# Patient Record
Sex: Female | Born: 1990 | Race: Black or African American | Hispanic: No | Marital: Single | Smoking: Current every day smoker
Health system: Southern US, Community
[De-identification: ages and names within clinical notes are randomized; demographics above are authoritative.]

## PROBLEM LIST (undated history)

## (undated) HISTORY — PX: INDUCED ABORTION: SHX677

---
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
## 2010-08-16 LAB — URINALYSIS, ROUTINE W REFLEX MICROSCOPIC
Ketones, ur: 15 mg/dL — AB
Leukocytes, UA: NEGATIVE
Nitrite: NEGATIVE
Protein, ur: NEGATIVE mg/dL
Urobilinogen, UA: 0.2 mg/dL (ref 0.0–1.0)

## 2011-02-11 LAB — CBC
HCT: 34.8 — ABNORMAL LOW
Hemoglobin: 11.9 — ABNORMAL LOW
MCHC: 34.3
RBC: 3.54 — ABNORMAL LOW
RDW: 13.1

## 2011-10-15 ENCOUNTER — Emergency Department (HOSPITAL_BASED_OUTPATIENT_CLINIC_OR_DEPARTMENT_OTHER): Payer: No Typology Code available for payment source

## 2011-10-15 ENCOUNTER — Encounter (HOSPITAL_BASED_OUTPATIENT_CLINIC_OR_DEPARTMENT_OTHER): Payer: Self-pay | Admitting: *Deleted

## 2011-10-15 ENCOUNTER — Emergency Department (HOSPITAL_BASED_OUTPATIENT_CLINIC_OR_DEPARTMENT_OTHER)
Admission: EM | Admit: 2011-10-15 | Discharge: 2011-10-15 | Disposition: A | Discharge status: Home / Self Care | Payer: No Typology Code available for payment source | Attending: Emergency Medicine | Admitting: Emergency Medicine

## 2011-10-15 DIAGNOSIS — B9689 Other specified bacterial agents as the cause of diseases classified elsewhere: Secondary | ICD-10-CM | POA: Insufficient documentation

## 2011-10-15 DIAGNOSIS — R109 Unspecified abdominal pain: Secondary | ICD-10-CM | POA: Insufficient documentation

## 2011-10-15 DIAGNOSIS — F17200 Nicotine dependence, unspecified, uncomplicated: Secondary | ICD-10-CM | POA: Insufficient documentation

## 2011-10-15 DIAGNOSIS — N898 Other specified noninflammatory disorders of vagina: Secondary | ICD-10-CM | POA: Insufficient documentation

## 2011-10-15 DIAGNOSIS — A499 Bacterial infection, unspecified: Secondary | ICD-10-CM | POA: Insufficient documentation

## 2011-10-15 DIAGNOSIS — N760 Acute vaginitis: Secondary | ICD-10-CM

## 2011-10-15 LAB — URINE MICROSCOPIC-ADD ON

## 2011-10-15 LAB — CBC
MCH: 34.2 pg — ABNORMAL HIGH (ref 26.0–34.0)
MCHC: 34.3 g/dL (ref 30.0–36.0)
MCV: 99.7 fL (ref 78.0–100.0)
Platelets: 286 10*3/uL (ref 150–400)

## 2011-10-15 LAB — DIFFERENTIAL
Basophils Relative: 0 % (ref 0–1)
Eosinophils Absolute: 0.1 10*3/uL (ref 0.0–0.7)
Eosinophils Relative: 1 % (ref 0–5)
Monocytes Relative: 9 % (ref 3–12)
Neutrophils Relative %: 76 % (ref 43–77)

## 2011-10-15 LAB — WET PREP, GENITAL

## 2011-10-15 LAB — BASIC METABOLIC PANEL
BUN: 8 mg/dL (ref 6–23)
Calcium: 9.5 mg/dL (ref 8.4–10.5)
GFR calc Af Amer: >90 mL/min (ref >90)
GFR calc non Af Amer: >90 mL/min (ref >90)
Glucose, Bld: 88 mg/dL (ref 70–99)
Potassium: 3.9 mEq/L (ref 3.5–5.1)
Sodium: 139 mEq/L (ref 135–145)

## 2011-10-15 LAB — URINALYSIS, ROUTINE W REFLEX MICROSCOPIC
Glucose, UA: NEGATIVE mg/dL
Ketones, ur: 40 mg/dL — AB
Nitrite: NEGATIVE
Specific Gravity, Urine: 1.039 — ABNORMAL HIGH (ref 1.005–1.030)
pH: 7 (ref 5.0–8.0)

## 2011-10-15 LAB — PREGNANCY, URINE: Preg Test, Ur: NEGATIVE

## 2011-10-15 MED ORDER — METRONIDAZOLE 500 MG PO TABS: 500.0000 mg | ORAL_TABLET | Freq: Two times a day (BID) | ORAL | Status: AC

## 2011-10-15 MED ORDER — NAPROXEN 500 MG PO TABS: 500.0000 mg | ORAL_TABLET | Freq: Two times a day (BID) | ORAL | Status: AC

## 2011-10-15 NOTE — ED Provider Notes (Signed)
History     CSN: [id number]  Arrival date & time 10/15/11  1408   None     2:52 PM HPI Patient reports lower abdominal cramping that began 3 days ago. Reports she's concerned she had a C-section 4 weeks ago. Reports she also began having vaginal bleeding about one or 2 days ago. Denies vaginal discharge, urinary symptoms, nausea, vomiting, diarrhea, back pain  Patient is a 21 y.o. female presenting with abdominal pain. The history is provided by the patient.  Abdominal Pain The primary symptoms of the illness include abdominal pain, vaginal discharge and vaginal bleeding. The primary symptoms of the illness do not include fever, shortness of breath, nausea, vomiting, diarrhea or dysuria. The onset of the illness was sudden. The problem has not changed since onset. The vaginal discharge is not associated with dysuria.   The patient states that she believes she is currently not pregnant. Symptoms associated with the illness do not include chills, anorexia, heartburn, constipation, urgency, hematuria, frequency or back pain.    History reviewed. No pertinent past medical history.  Past Surgical History  Procedure Date  . Cesarean section     History reviewed. No pertinent family history.  History  Substance Use Topics  . Smoking status: Current Everyday Smoker  . Smokeless tobacco: Not on file  . Alcohol Use: No    OB History    Grav Para Term Preterm Abortions TAB SAB Ect Mult Living   1 1              Review of Systems  Constitutional: Negative for fever and chills.  Respiratory: Negative for shortness of breath.   Cardiovascular: Negative for chest pain.  Gastrointestinal: Positive for abdominal pain. Negative for heartburn, nausea, vomiting, diarrhea, constipation and anorexia.  Genitourinary: Positive for vaginal bleeding and vaginal discharge. Negative for dysuria, urgency, frequency, hematuria, flank pain and vaginal pain.  Musculoskeletal: Negative for back pain.    All other systems reviewed and are negative.    Allergies  Review of patient's allergies indicates no known allergies.  Home Medications  No current outpatient prescriptions on file.  BP 116/63  Pulse 84  Temp(Src) 97.8 F (36.6 C) (Oral)  Resp 20  Ht 5\' 5"  (1.651 m)  Wt 127 lb (57.607 kg)  BMI 21.13 kg/m2  SpO2 100%  Physical Exam  Vitals reviewed. Constitutional: She is oriented to person, place, and time. Vital signs are normal. She appears well-developed and well-nourished.  HENT:  Head: Normocephalic and atraumatic.  Eyes: Conjunctivae are normal. Pupils are equal, round, and reactive to light.  Neck: Normal range of motion. Neck supple.  Cardiovascular: Normal rate, regular rhythm and normal heart sounds.  Exam reveals no friction rub.   No murmur heard. Pulmonary/Chest: Effort normal and breath sounds normal. She has no wheezes. She has no rhonchi. She has no rales. She exhibits no tenderness.  Abdominal: Soft. Bowel sounds are normal. She exhibits no distension and no mass. There is no tenderness. There is no rebound and no guarding.  Genitourinary: There is no tenderness or lesion on the right labia. There is no tenderness or lesion on the left labia. Uterus is not tender. Cervix exhibits no motion tenderness and no discharge. Right adnexum displays no tenderness and no fullness. Left adnexum displays no tenderness and no fullness. Vaginal discharge (copious yellow discharge) found.  Musculoskeletal: Normal range of motion.  Lymphadenopathy:       Left: No inguinal adenopathy present.  Neurological: She is alert and  oriented to person, place, and time. Coordination normal.  Skin: Skin is warm and dry. No rash noted. No erythema. No pallor.    ED Course  Procedures  Results for orders placed during the hospital encounter of 10/15/11  CBC      Component Value Range   WBC 11.4 (*) 4.0 - 10.5 (K/uL)   RBC 3.83 (*) 3.87 - 5.11 (MIL/uL)   Hemoglobin 13.1  12.0 -  15.0 (g/dL)   HCT 16.1  09.6 - 04.5 (%)   MCV 99.7  78.0 - 100.0 (fL)   MCH 34.2 (*) 26.0 - 34.0 (pg)   MCHC 34.3  30.0 - 36.0 (g/dL)   RDW 40.9  81.1 - 91.4 (%)   Platelets 286  150 - 400 (K/uL)  DIFFERENTIAL      Component Value Range   Neutrophils Relative 76  43 - 77 (%)   Neutro Abs 8.7 (*) 1.7 - 7.7 (K/uL)   Lymphocytes Relative 15  12 - 46 (%)   Lymphs Abs 1.7  0.7 - 4.0 (K/uL)   Monocytes Relative 9  3 - 12 (%)   Monocytes Absolute 1.0  0.1 - 1.0 (K/uL)   Eosinophils Relative 1  0 - 5 (%)   Eosinophils Absolute 0.1  0.0 - 0.7 (K/uL)   Basophils Relative 0  0 - 1 (%)   Basophils Absolute 0.0  0.0 - 0.1 (K/uL)  BASIC METABOLIC PANEL      Component Value Range   Sodium 139  135 - 145 (mEq/L)   Potassium 3.9  3.5 - 5.1 (mEq/L)   Chloride 103  96 - 112 (mEq/L)   CO2 28  19 - 32 (mEq/L)   Glucose, Bld 88  70 - 99 (mg/dL)   BUN 8  6 - 23 (mg/dL)   Creatinine, Ser 7.82  0.50 - 1.10 (mg/dL)   Calcium 9.5  8.4 - 95.6 (mg/dL)   GFR calc non Af Amer >90  >90 (mL/min)   GFR calc Af Amer >90  >90 (mL/min)  WET PREP, GENITAL      Component Value Range   Yeast Wet Prep HPF POC NONE SEEN  NONE SEEN    Trich, Wet Prep NONE SEEN  NONE SEEN    Clue Cells Wet Prep HPF POC MANY (*) NONE SEEN    WBC, Wet Prep HPF POC MANY (*) NONE SEEN   URINALYSIS, ROUTINE W REFLEX MICROSCOPIC      Component Value Range   Color, Urine AMBER (*) YELLOW    APPearance CLOUDY (*) CLEAR    Specific Gravity, Urine 1.039 (*) 1.005 - 1.030    pH 7.0  5.0 - 8.0    Glucose, UA NEGATIVE  NEGATIVE (mg/dL)   Hgb urine dipstick NEGATIVE  NEGATIVE    Bilirubin Urine SMALL (*) NEGATIVE    Ketones, ur 40 (*) NEGATIVE (mg/dL)   Protein, ur 213 (*) NEGATIVE (mg/dL)   Urobilinogen, UA 1.0  0.0 - 1.0 (mg/dL)   Nitrite NEGATIVE  NEGATIVE    Leukocytes, UA SMALL (*) NEGATIVE   PREGNANCY, URINE      Component Value Range   Preg Test, Ur NEGATIVE  NEGATIVE   URINE MICROSCOPIC-ADD ON      Component Value Range    Squamous Epithelial / LPF MANY (*) RARE    WBC, UA 7-10  <3 (WBC/hpf)   RBC / HPF 0-2  <3 (RBC/hpf)   Bacteria, UA MANY (*) RARE    Urine-Other MUCOUS PRESENT  Dg Abd 2 Views  10/15/2011  *RADIOLOGY REPORT*  Clinical Data: C-section 4 weeks ago, with abdominal pain  ABDOMEN - 2 VIEW  Comparison: None.  Findings: Lung bases are clear.  The bowel gas pattern is nonobstructive.  There are two tiny ( approximately 2-3 mm) metallic densities projecting over the right aspect of the sacrum. These could be small surgical clips or suture.  Suggest clinical correlation.  No evidence of free intraperitoneal air.  The bones are unremarkable.  IMPRESSION:  1.  Nonobstructive bowel gas pattern. 2.  Two tiny metallic densities project over the right aspect of the pelvis, likely surgical clips or suture.  Original Report Authenticated By: Britta Mccreedy, M.D.     MDM   Will treat for BV. Advised f/u with GYN if no improvement of abdominal pain in 2-3 day. Advised patient that she has a pending GC and Chlamydia. Patient voices understanding and reactive to      Thomasene Lot, PA-C 10/15/11 1714

## 2011-10-15 NOTE — Discharge Instructions (Signed)
Abdominal Pain Abdominal pain can be caused by many things. Your caregiver decides the seriousness of your pain by an examination and possibly blood tests and X-rays. Many cases can be observed and treated at home. Most abdominal pain is not caused by a disease and will probably improve without treatment. However, in many cases, more time must pass before a clear cause of the pain can be found. Before that point, it may not be known if you need more testing, or if hospitalization or surgery is needed. HOME CARE INSTRUCTIONS   Do not take laxatives unless directed by your caregiver.   Take pain medicine only as directed by your caregiver.   Only take over-the-counter or prescription medicines for pain, discomfort, or fever as directed by your caregiver.   Try a clear liquid diet (broth, tea, or water) for as long as directed by your caregiver. Slowly move to a bland diet as tolerated.  SEEK IMMEDIATE MEDICAL CARE IF:   The pain does not go away.   You have a fever.   You keep throwing up (vomiting).   The pain is felt only in portions of the abdomen. Pain in the right side could possibly be appendicitis. In an adult, pain in the left lower portion of the abdomen could be colitis or diverticulitis.   You pass bloody or black tarry stools.  MAKE SURE YOU:   Understand these instructions.   Will watch your condition.   Will get help right away if you are not doing well or get worse.  Document Released: 02/16/2005 Document Revised: 04/28/2011 Document Reviewed: 12/26/2007 [facility] Patient Information 2012 [place], [place].Bacterial Vaginosis Bacterial vaginosis (BV) is a vaginal infection where the normal balance of bacteria in the vagina is disrupted. The normal balance is then replaced by an overgrowth of certain bacteria. There are several different kinds of bacteria that can cause BV. BV is the most common vaginal infection in women of childbearing age. CAUSES   The cause of BV is not  fully understood. BV develops when there is an increase or imbalance of harmful bacteria.   Some activities or behaviors can upset the normal balance of bacteria in the vagina and put women at increased risk including:   Having a new sex partner or multiple sex partners.   Douching.   Using an intrauterine device (IUD) for contraception.   It is not clear what role sexual activity plays in the development of BV. However, women that have never had sexual intercourse are rarely infected with BV.  Women do not get BV from toilet seats, bedding, swimming pools or from touching objects around them.  SYMPTOMS   Grey vaginal discharge.   A fish-like odor with discharge, especially after sexual intercourse.   Itching or burning of the vagina and vulva.   Burning or pain with urination.   Some women have no signs or symptoms at all.  DIAGNOSIS  Your caregiver must examine the vagina for signs of BV. Your caregiver will perform lab tests and look at the sample of vaginal fluid through a microscope. They will look for bacteria and abnormal cells (clue cells), a pH test higher than 4.5, and a positive amine test all associated with BV.  RISKS AND COMPLICATIONS   Pelvic inflammatory disease (PID).   Infections following gynecology surgery.   Developing HIV.   Developing herpes virus.  TREATMENT  Sometimes BV will clear up without treatment. However, all women with symptoms of BV should be treated to avoid complications,  especially if gynecology surgery is planned. Female partners generally do not need to be treated. However, BV may spread between female sex partners so treatment is helpful in preventing a recurrence of BV.   BV may be treated with antibiotics. The antibiotics come in either pill or vaginal cream forms. Either can be used with nonpregnant or pregnant women, but the recommended dosages differ. These antibiotics are not harmful to the baby.   BV can recur after treatment. If  this happens, a second round of antibiotics will often be prescribed.   Treatment is important for pregnant women. If not treated, BV can cause a premature delivery, especially for a pregnant woman who had a premature birth in the past. All pregnant women who have symptoms of BV should be checked and treated.   For chronic reoccurrence of BV, treatment with a type of prescribed gel vaginally twice a week is helpful.  HOME CARE INSTRUCTIONS   Finish all medication as directed by your caregiver.   Do not have sex until treatment is completed.   Tell your sexual partner that you have a vaginal infection. They should see their caregiver and be treated if they have problems, such as a mild rash or itching.   Practice safe sex. Use condoms. Only have 1 sex partner.  PREVENTION  Basic prevention steps can help reduce the risk of upsetting the natural balance of bacteria in the vagina and developing BV:  Do not have sexual intercourse (be abstinent).   Do not douche.   Use all of the medicine prescribed for treatment of BV, even if the signs and symptoms go away.   Tell your sex partner if you have BV. That way, they can be treated, if needed, to prevent reoccurrence.  SEEK MEDICAL CARE IF:   Your symptoms are not improving after 3 days of treatment.   You have increased discharge, pain, or fever.  MAKE SURE YOU:   Understand these instructions.   Will watch your condition.   Will get help right away if you are not doing well or get worse.  FOR MORE INFORMATION  Division of STD Prevention (DSTDP), Centers for Disease Control and Prevention: [employer] American Social Health Association (ASHA): www.ashastd.org  Document Released: 05/09/2005 Document Revised: 04/28/2011 Document Reviewed: 10/30/2008 [facility] Patient Information 2012 [place], [place].

## 2011-10-15 NOTE — ED Notes (Addendum)
Pt c/o generalized abd pain onset Thursday. S/P C-section 4 weeks ago. Some vag bleeding at present. Denies other s/s.

## 2011-10-15 NOTE — ED Provider Notes (Signed)
Medical screening examination/treatment/procedure(s) were performed by non-physician practitioner and as supervising physician I was immediately available for consultation/collaboration.  Ethelda Chick, MD 10/15/11 [phone]

## 2011-10-19 LAB — GC/CHLAMYDIA PROBE AMP, GENITAL: Chlamydia, DNA Probe: NEGATIVE

## 2011-10-20 NOTE — ED Notes (Signed)
+  Gonorrhea. Chart sent to EDP office for review. DHHS attached. °

## 2011-10-20 NOTE — ED Notes (Signed)
Chart returned from EDP office. If patient did not receive treatment for suspected STD during prior visit, then recommend return to ER for administration of Rocephin 250 mg IM one time dose. No sexual activity x 1 week or until STD resolved. Partner needs to be tested. Prescribed/reviewed by Fayrene Helper PA-C.

## 2011-10-22 NOTE — ED Notes (Signed)
Attempted to call patient. No answer. Left voicemail.

## 2011-10-23 NOTE — ED Notes (Signed)
Attempt to call patient, message left to call flow manager's #

## 2011-11-15 NOTE — ED Notes (Signed)
Letter sent to Epic Addressno response after 30 days -chart appended and closed out.

## 2013-04-06 ENCOUNTER — Emergency Department (HOSPITAL_BASED_OUTPATIENT_CLINIC_OR_DEPARTMENT_OTHER)
Admission: EM | Admit: 2013-04-06 | Discharge: 2013-04-06 | Disposition: A | Discharge status: Home / Self Care | Payer: 59 | Attending: Emergency Medicine | Admitting: Emergency Medicine

## 2013-04-06 ENCOUNTER — Encounter (HOSPITAL_BASED_OUTPATIENT_CLINIC_OR_DEPARTMENT_OTHER): Payer: Self-pay | Admitting: Emergency Medicine

## 2013-04-06 DIAGNOSIS — Z3202 Encounter for pregnancy test, result negative: Secondary | ICD-10-CM | POA: Insufficient documentation

## 2013-04-06 DIAGNOSIS — R509 Fever, unspecified: Secondary | ICD-10-CM | POA: Insufficient documentation

## 2013-04-06 DIAGNOSIS — N760 Acute vaginitis: Secondary | ICD-10-CM | POA: Insufficient documentation

## 2013-04-06 DIAGNOSIS — Z9889 Other specified postprocedural states: Secondary | ICD-10-CM | POA: Insufficient documentation

## 2013-04-06 DIAGNOSIS — A499 Bacterial infection, unspecified: Secondary | ICD-10-CM | POA: Insufficient documentation

## 2013-04-06 DIAGNOSIS — B9689 Other specified bacterial agents as the cause of diseases classified elsewhere: Secondary | ICD-10-CM | POA: Insufficient documentation

## 2013-04-06 DIAGNOSIS — R109 Unspecified abdominal pain: Secondary | ICD-10-CM

## 2013-04-06 LAB — URINALYSIS, ROUTINE W REFLEX MICROSCOPIC
Bilirubin Urine: NEGATIVE
Glucose, UA: NEGATIVE mg/dL
Hgb urine dipstick: NEGATIVE
Ketones, ur: NEGATIVE mg/dL
Leukocytes, UA: NEGATIVE
Protein, ur: NEGATIVE mg/dL
pH: 6.5 (ref 5.0–8.0)

## 2013-04-06 LAB — WET PREP, GENITAL
Trich, Wet Prep: NONE SEEN
WBC, Wet Prep HPF POC: NONE SEEN

## 2013-04-06 MED ORDER — METRONIDAZOLE 500 MG PO TABS
2000.0000 mg | ORAL_TABLET | Freq: Once | ORAL | Status: AC
  Administered 2013-04-06: 2000 mg via ORAL
  Filled 2013-04-06: qty 4

## 2013-04-06 MED ORDER — ONDANSETRON 8 MG PO TBDP
8.0000 mg | ORAL_TABLET | Freq: Once | ORAL | Status: AC
  Administered 2013-04-06: 8 mg via ORAL
  Filled 2013-04-06: qty 1

## 2013-04-06 NOTE — ED Notes (Signed)
Pt asked for guidance on how to prevent UTI/frequent vaginal infections.  Instructed patient on avoiding scented feminine products, urinating after sexual intercourse, wearing cotton underwear, avoiding irritating products which may help.  Pt verbalized understanding.

## 2013-04-06 NOTE — ED Notes (Signed)
Pt having right sided lower abdominal pain x 2 days.  No N/V/D.  Denies dysuria but did have fever yesterday.

## 2013-04-06 NOTE — ED Provider Notes (Addendum)
CSN: [id number]     Arrival date & time 04/06/13  6440 History  This chart was scribed for Hilario Quarry, MD by Clydene Laming, ED Scribe. This patient was seen in room MH07/MH07 and the patient's care was started at 7:00 PM.    Chief Complaint  Patient presents with  . Abdominal Pain    The history is provided by the patient. No language interpreter was used.  HPI Comments: Heather Sharp is a 22 y.o. female who presents to the Emergency Department complaining of stabbing, constant abd pain beginning yesterday. Pt also states she had a fever last night. Pt denies nausea, chills, vaginal discharge, vomiting, and diarrhea. She states the pain is not improved or worsened wit any treatments. Pt states her symptoms are similar to when she was positive for chlamydia , treated last year. Pt is on birth control and reports having one sexual partner.  History reviewed. No pertinent past medical history. Past Surgical History  Procedure Laterality Date  . Cesarean section     History reviewed. No pertinent family history. History  Substance Use Topics  . Smoking status: Current Every Day Smoker  . Smokeless tobacco: Not on file  . Alcohol Use: No   OB History   Grav Para Term Preterm Abortions TAB SAB Ect Mult Living   1 1             Review of Systems  Constitutional: Positive for fever.  Gastrointestinal: Positive for abdominal pain. Negative for nausea, vomiting and diarrhea.  Genitourinary: Negative for dysuria and vaginal discharge.    Allergies  Review of patient's allergies indicates no known allergies.  Home Medications   Current Outpatient Rx  Name  Route  Sig  Dispense  Refill  . UNKNOWN TO PATIENT      Birth control pills          Triage Vitals:BP 138/66  Pulse 88  Temp(Src) 98.8 F (37.1 C)  Resp 16  Ht 5\' 5"  (1.651 m)  Wt 123 lb 11.2 oz (56.11 kg)  BMI 20.58 kg/m2  SpO2 97%  LMP 03/16/2013 Physical Exam  Nursing note and vitals reviewed. Constitutional:  She is oriented to person, place, and time. She appears well-developed and well-nourished.  HENT:  Head: Normocephalic and atraumatic.  Right Ear: External ear normal.  Left Ear: External ear normal.  Nose: Nose normal.  Mouth/Throat: Oropharynx is clear and moist.  Eyes: Conjunctivae and EOM are normal. Pupils are equal, round, and reactive to light.  Neck: Normal range of motion. Neck supple.  Cardiovascular: Normal rate, regular rhythm, normal heart sounds and intact distal pulses.   Pulmonary/Chest: Effort normal and breath sounds normal.  Abdominal: Soft. Bowel sounds are normal.  Genitourinary: Vagina normal and uterus normal. No vaginal discharge found.  Musculoskeletal: Normal range of motion.  Neurological: She is alert and oriented to person, place, and time. She has normal reflexes.  Skin: Skin is warm and dry.  Psychiatric: She has a normal mood and affect. Her behavior is normal. Thought content normal.    ED Course  Procedures (including critical care time) DIAGNOSTIC STUDIES: Oxygen Saturation is 97% on RA, normal by my interpretation.    COORDINATION OF CARE: 7:08 PM- Discussed treatment plan with pt at bedside. Pt verbalized understanding and agreement with plan.   Labs Review Labs Reviewed  URINALYSIS, ROUTINE W REFLEX MICROSCOPIC  PREGNANCY, URINE   Imaging Review No results found.  EKG Interpretation   None     22  y.o. Female with abdominal discomfort and vomiting.  Pelvic exam with mild discharge,no cmt.  Wet prep positive for bv and treated with metronidazole.   MDM  No diagnosis found. I personally performed the services described in this documentation, which was scribed in my presence. The recorded information has been reviewed and considered.      Hilario Quarry, MD 04/06/13 1610  Hilario Quarry, MD 04/06/13 [phone]

## 2013-04-08 LAB — GC/CHLAMYDIA PROBE AMP: GC Probe RNA: NEGATIVE

## 2013-04-09 ENCOUNTER — Telehealth (HOSPITAL_BASED_OUTPATIENT_CLINIC_OR_DEPARTMENT_OTHER): Payer: Self-pay | Admitting: Emergency Medicine

## 2013-08-30 ENCOUNTER — Encounter (HOSPITAL_BASED_OUTPATIENT_CLINIC_OR_DEPARTMENT_OTHER): Payer: Self-pay | Admitting: Emergency Medicine

## 2013-08-30 ENCOUNTER — Emergency Department (HOSPITAL_BASED_OUTPATIENT_CLINIC_OR_DEPARTMENT_OTHER)
Admission: EM | Admit: 2013-08-30 | Discharge: 2013-08-30 | Disposition: A | Discharge status: Home / Self Care | Payer: 59 | Attending: Emergency Medicine | Admitting: Emergency Medicine

## 2013-08-30 DIAGNOSIS — F17200 Nicotine dependence, unspecified, uncomplicated: Secondary | ICD-10-CM | POA: Insufficient documentation

## 2013-08-30 DIAGNOSIS — T148XXA Other injury of unspecified body region, initial encounter: Secondary | ICD-10-CM

## 2013-08-30 DIAGNOSIS — IMO0002 Reserved for concepts with insufficient information to code with codable children: Secondary | ICD-10-CM | POA: Insufficient documentation

## 2013-08-30 DIAGNOSIS — Y9389 Activity, other specified: Secondary | ICD-10-CM | POA: Insufficient documentation

## 2013-08-30 DIAGNOSIS — Y92410 Unspecified street and highway as the place of occurrence of the external cause: Secondary | ICD-10-CM | POA: Insufficient documentation

## 2013-08-30 MED ORDER — CYCLOBENZAPRINE HCL 10 MG PO TABS: 10.0000 mg | ORAL_TABLET | Freq: Two times a day (BID) | ORAL | Status: DC | PRN

## 2013-08-30 MED ORDER — IBUPROFEN 800 MG PO TABS: 800.0000 mg | ORAL_TABLET | Freq: Three times a day (TID) | ORAL | Status: DC

## 2013-08-30 MED ORDER — HYDROCODONE-ACETAMINOPHEN 5-325 MG PO TABS: 1.0000 | ORAL_TABLET | ORAL | Status: DC | PRN

## 2013-08-30 NOTE — Discharge Instructions (Signed)
Cryotherapy Cryotherapy means treatment with cold. Ice or gel packs can be used to reduce both pain and swelling. Ice is the most helpful within the first 24 to 48 hours after an injury or flareup from overusing a muscle or joint. Sprains, strains, spasms, burning pain, shooting pain, and aches can all be eased with ice. Ice can also be used when recovering from surgery. Ice is effective, has very few side effects, and is safe for most people to use. PRECAUTIONS  Ice is not a safe treatment option for people with:  Raynaud's phenomenon. This is a condition affecting small blood vessels in the extremities. Exposure to cold may cause your problems to return.  Cold hypersensitivity. There are many forms of cold hypersensitivity, including:  Cold urticaria. Red, itchy hives appear on the skin when the tissues begin to warm after being iced.  Cold erythema. This is a red, itchy rash caused by exposure to cold.  Cold hemoglobinuria. Red blood cells break down when the tissues begin to warm after being iced. The hemoglobin that carry oxygen are passed into the urine because they cannot combine with blood proteins fast enough.  Numbness or altered sensitivity in the area being iced. If you have any of the following conditions, do not use ice until you have discussed cryotherapy with your caregiver:  Heart conditions, such as arrhythmia, angina, or chronic heart disease.  High blood pressure.  Healing wounds or open skin in the area being iced.  Current infections.  Rheumatoid arthritis.  Poor circulation.  Diabetes. Ice slows the blood flow in the region it is applied. This is beneficial when trying to stop inflamed tissues from spreading irritating chemicals to surrounding tissues. However, if you expose your skin to cold temperatures for too long or without the proper protection, you can damage your skin or nerves. Watch for signs of skin damage due to cold. HOME CARE INSTRUCTIONS Follow  these tips to use ice and cold packs safely.  Place a dry or damp towel between the ice and skin. A damp towel will cool the skin more quickly, so you may need to shorten the time that the ice is used.  For a more rapid response, add gentle compression to the ice.  Ice for no more than 10 to 20 minutes at a time. The bonier the area you are icing, the less time it will take to get the benefits of ice.  Check your skin after 5 minutes to make sure there are no signs of a poor response to cold or skin damage.  Rest 20 minutes or more in between uses.  Once your skin is numb, you can end your treatment. You can test numbness by very lightly touching your skin. The touch should be so light that you do not see the skin dimple from the pressure of your fingertip. When using ice, most people will feel these normal sensations in this order: cold, burning, aching, and numbness.  Do not use ice on someone who cannot communicate their responses to pain, such as small children or people with dementia. HOW TO MAKE AN ICE PACK Ice packs are the most common way to use ice therapy. Other methods include ice massage, ice baths, and cryo-sprays. Muscle creams that cause a cold, tingly feeling do not offer the same benefits that ice offers and should not be used as a substitute unless recommended by your caregiver. To make an ice pack, do one of the following:  Place crushed ice or  a bag of frozen vegetables in a sealable plastic bag. Squeeze out the excess air. Place this bag inside another plastic bag. Slide the bag into a pillowcase or place a damp towel between your skin and the bag.  Mix 3 parts water with 1 part rubbing alcohol. Freeze the mixture in a sealable plastic bag. When you remove the mixture from the freezer, it will be slushy. Squeeze out the excess air. Place this bag inside another plastic bag. Slide the bag into a pillowcase or place a damp towel between your skin and the bag. SEEK MEDICAL  CARE IF:  You develop white spots on your skin. This may give the skin a blotchy (mottled) appearance.  Your skin turns blue or pale.  Your skin becomes waxy or hard.  Your swelling gets worse. MAKE SURE YOU:   Understand these instructions.  Will watch your condition.  Will get help right away if you are not doing well or get worse. Document Released: 01/03/2011 Document Revised: 08/01/2011 Document Reviewed: 01/03/2011 [facility] Patient Information 2014 [place], [place]. Muscle Strain A muscle strain is an injury that occurs when a muscle is stretched beyond its normal length. Usually a small number of muscle fibers are torn when this happens. Muscle strain is rated in degrees. First-degree strains have the least amount of muscle fiber tearing and pain. Second-degree and third-degree strains have increasingly more tearing and pain.  Usually, recovery from muscle strain takes 1 2 weeks. Complete healing takes 5 6 weeks.  CAUSES  Muscle strain happens when a sudden, violent force placed on a muscle stretches it too far. This may occur with lifting, sports, or a fall.  RISK FACTORS Muscle strain is especially common in athletes.  SIGNS AND SYMPTOMS At the site of the muscle strain, there may be:  Pain.  Bruising.  Swelling.  Difficulty using the muscle due to pain or lack of normal function. DIAGNOSIS  Your health care provider will perform a physical exam and ask about your medical history. TREATMENT  Often, the best treatment for a muscle strain is resting, icing, and applying cold compresses to the injured area.  HOME CARE INSTRUCTIONS   Use the PRICE method of treatment to promote muscle healing during the first 2 3 days after your injury. The PRICE method involves:  Protecting the muscle from being injured again.  Restricting your activity and resting the injured body part.  Icing your injury. To do this, put ice in a plastic bag. Place a towel between your skin and  the bag. Then, apply the ice and leave it on from 15 20 minutes each hour. After the third day, switch to moist heat packs.  Apply compression to the injured area with a splint or elastic bandage. Be careful not to wrap it too tightly. This may interfere with blood circulation or increase swelling.  Elevate the injured body part above the level of your heart as often as you can.  Only take over-the-counter or prescription medicines for pain, discomfort, or fever as directed by your health care provider.  Warming up prior to exercise helps to prevent future muscle strains. SEEK MEDICAL CARE IF:   You have increasing pain or swelling in the injured area.  You have numbness, tingling, or a significant loss of strength in the injured area. MAKE SURE YOU:   Understand these instructions.  Will watch your condition.  Will get help right away if you are not doing well or get worse. Document Released: 05/09/2005 Document  Revised: 02/27/2013 Document Reviewed: 12/06/2012 [facility] Patient Information 2014 [place], [place].

## 2013-08-30 NOTE — ED Provider Notes (Signed)
Medical screening examination/treatment/procedure(s) were performed by non-physician practitioner and as supervising physician I was immediately available for consultation/collaboration.   EKG Interpretation None        Charles B. Karle Starch, MD 08/30/13 1259
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
## 2013-09-05 ENCOUNTER — Emergency Department (HOSPITAL_BASED_OUTPATIENT_CLINIC_OR_DEPARTMENT_OTHER)
Admission: EM | Admit: 2013-09-05 | Discharge: 2013-09-05 | Disposition: A | Discharge status: Home / Self Care | Payer: 59 | Attending: Emergency Medicine | Admitting: Emergency Medicine

## 2013-09-05 ENCOUNTER — Encounter (HOSPITAL_BASED_OUTPATIENT_CLINIC_OR_DEPARTMENT_OTHER): Payer: Self-pay | Admitting: Emergency Medicine

## 2013-09-05 DIAGNOSIS — A499 Bacterial infection, unspecified: Secondary | ICD-10-CM | POA: Insufficient documentation

## 2013-09-05 DIAGNOSIS — N760 Acute vaginitis: Secondary | ICD-10-CM | POA: Insufficient documentation

## 2013-09-05 DIAGNOSIS — F17200 Nicotine dependence, unspecified, uncomplicated: Secondary | ICD-10-CM | POA: Insufficient documentation

## 2013-09-05 DIAGNOSIS — Z9889 Other specified postprocedural states: Secondary | ICD-10-CM | POA: Insufficient documentation

## 2013-09-05 DIAGNOSIS — Z79899 Other long term (current) drug therapy: Secondary | ICD-10-CM | POA: Insufficient documentation

## 2013-09-05 DIAGNOSIS — Z202 Contact with and (suspected) exposure to infections with a predominantly sexual mode of transmission: Secondary | ICD-10-CM

## 2013-09-05 DIAGNOSIS — B9689 Other specified bacterial agents as the cause of diseases classified elsewhere: Secondary | ICD-10-CM | POA: Insufficient documentation

## 2013-09-05 DIAGNOSIS — Z3202 Encounter for pregnancy test, result negative: Secondary | ICD-10-CM | POA: Insufficient documentation

## 2013-09-05 LAB — URINE MICROSCOPIC-ADD ON

## 2013-09-05 LAB — URINALYSIS, ROUTINE W REFLEX MICROSCOPIC
Glucose, UA: NEGATIVE mg/dL
Hgb urine dipstick: NEGATIVE
Ketones, ur: 15 mg/dL — AB
NITRITE: NEGATIVE
PH: 6.5 (ref 5.0–8.0)
PROTEIN: NEGATIVE mg/dL
Specific Gravity, Urine: 1.03 (ref 1.005–1.030)
Urobilinogen, UA: 1 mg/dL (ref 0.0–1.0)

## 2013-09-05 LAB — PREGNANCY, URINE: Preg Test, Ur: NEGATIVE

## 2013-09-05 LAB — WET PREP, GENITAL
Trich, Wet Prep: NONE SEEN
Yeast Wet Prep HPF POC: NONE SEEN

## 2013-09-05 MED ORDER — CEFTRIAXONE SODIUM 250 MG IJ SOLR
250.0000 mg | Freq: Once | INTRAMUSCULAR | Status: AC
  Administered 2013-09-05: 250 mg via INTRAMUSCULAR
  Filled 2013-09-05: qty 250

## 2013-09-05 MED ORDER — AZITHROMYCIN 250 MG PO TABS
1000.0000 mg | ORAL_TABLET | Freq: Once | ORAL | Status: AC
  Administered 2013-09-05: 1000 mg via ORAL
  Filled 2013-09-05: qty 4

## 2013-09-05 MED ORDER — METRONIDAZOLE 500 MG PO TABS: 500.0000 mg | ORAL_TABLET | Freq: Two times a day (BID) | ORAL | Status: DC

## 2013-09-05 MED ORDER — LIDOCAINE HCL (PF) 1 % IJ SOLN
INTRAMUSCULAR | Status: AC
  Administered 2013-09-05: 1.2 mL
  Filled 2013-09-05: qty 5

## 2013-09-05 NOTE — ED Provider Notes (Signed)
CSN: [id number]     Arrival date & time 09/05/13  1402 History   First MD Initiated Contact with Patient 09/05/13 1414     Chief Complaint  Patient presents with  . Vaginal Discharge     (Consider location/radiation/quality/duration/timing/severity/associated sxs/prior Treatment) HPI Comments: Patient is a 23 year old female who presents with vaginal discharge for the past several days. Boyfriend informed her he had chlamydia. She denies any bleeding. Her last menstrual period was one week ago and normal.  Patient is a 23 y.o. female presenting with vaginal discharge. The history is provided by the patient.  Vaginal Discharge Quality:  White Severity:  Moderate Onset quality:  Gradual Duration:  3 days Timing:  Constant Progression:  Worsening Chronicity:  New Relieved by:  Nothing Worsened by:  Nothing tried   History reviewed. No pertinent past medical history. Past Surgical History  Procedure Laterality Date  . Cesarean section     History reviewed. No pertinent family history. History  Substance Use Topics  . Smoking status: Current Every Day Smoker -- 0.50 packs/day    Types: Cigarettes  . Smokeless tobacco: Not on file  . Alcohol Use: No   OB History   Grav Para Term Preterm Abortions TAB SAB Ect Mult Living   1 1             Review of Systems  Genitourinary: Positive for vaginal discharge.  All other systems reviewed and are negative.     Allergies  Review of patient's allergies indicates no known allergies.  Home Medications   Prior to Admission medications   Medication Sig Start Date End Date Taking? Authorizing Provider  ibuprofen (ADVIL,MOTRIN) 800 MG tablet Take 1 tablet (800 mg total) by mouth 3 (three) times daily. 08/30/13   Shari A Upstill, PA-C  Levonorgestrel-Ethinyl Estrad (KURVELO PO) Take by mouth.    Historical Provider, MD  UNKNOWN TO PATIENT Birth control pills    Historical Provider, MD   BP 125/60  Pulse 83  Temp(Src) 97.6 F  (36.4 C)  Resp 16  Ht 5\' 5"  (1.651 m)  Wt 122 lb (55.339 kg)  BMI 20.30 kg/m2  SpO2 100%  LMP 08/28/2013 Physical Exam  Nursing note and vitals reviewed. Constitutional: She is oriented to person, place, and time. She appears well-developed and well-nourished.  HENT:  Head: Normocephalic and atraumatic.  Neck: Normal range of motion. Neck supple.  Cardiovascular: Normal rate.   Pulmonary/Chest: Effort normal.  Abdominal: Soft. Bowel sounds are normal.  Genitourinary: Vaginal discharge found.  There is a slight whitish discharge present. There is no cervical motion tenderness and no adnexal masses.  Musculoskeletal: Normal range of motion.  Neurological: She is alert and oriented to person, place, and time.    ED Course  Procedures (including critical care time) Labs Review Labs Reviewed  GC/CHLAMYDIA PROBE AMP  WET PREP, GENITAL  PREGNANCY, URINE  URINALYSIS, ROUTINE W REFLEX MICROSCOPIC    Imaging Review No results found.   EKG Interpretation None      MDM   Final diagnoses:  None    Wet prep reveals moderate clue cells. Will treat with Flagyl. She also will be treated with Rocephin and Zithromax to cover for [facility] and chlamydia.    Veryl Speak, MD 09/05/13 [phone]

## 2013-09-05 NOTE — ED Notes (Signed)
Pt c/o sm amt vaginal discharge x 1 day

## 2013-09-05 NOTE — ED Notes (Signed)
MD at bedside discussing test results and plan of care. 

## 2013-09-05 NOTE — Discharge Instructions (Signed)
Flagyl as prescribed.  We will call you if your cultures indicate you require additional treatment.  Return to the ER for worsening pain, vomiting, high fever, or if you experience other new and bothersome symptoms.   Chlamydia, Female Chlamydia is an infection. It is spread through sexual contact. Chlamydia can be in different areas of the body. These areas include the cervix, urethra, throat, or rectum. You may not know you have chlamydia because many people never develop the symptoms. Chlamydia is not difficult to treat once you know you have it. However, if it is left untreated, chlamydia can lead to more serious health problems.  CAUSES  Chlamydia is caused by bacteria. It is a sexually transmitted disease. It is passed from an infected partner during intimate contact. This contact could be with the genitals, mouth, or rectal area. Chlamydia can also be passed from mothers to babies during birth. SIGNS AND SYMPTOMS  There may not be any symptoms. This is often the case early in the infection. If symptoms develop, they may include:  Mild pain and discomfort when urinating.  Redness, soreness, and swelling (inflammation) of the rectum.  Vaginal discharge.  Painful intercourse.  Abdominal pain.  Bleeding between menstrual periods. DIAGNOSIS  To diagnose this infection, your health care provider will do a pelvic exam. Cultures will be taken of the vagina, cervix, urine, and possibly the rectum to verify the diagnosis.  TREATMENT You will be given antibiotic medicines. If you are pregnant, certain types of antibiotics will need to be avoided. Any sexual partners should also be treated, even if they do not show symptoms.  HOME CARE INSTRUCTIONS   Take your antibiotics as directed. Make sure you finish them even if you start to feel better.  Only take over-the-counter or prescription medicines for pain, discomfort, or fever as directed by your health care provider.  Inform any sexual  partners about the infection. They should also be treated.  Do not have sexual contact until your health care provider tells you it is okay.  Get plenty of rest.  Eat a well-balanced diet.  Drink enough fluids to keep your urine clear or pale yellow.  Follow up with your health care provider as directed. SEEK MEDICAL CARE IF:  You have painful urination.  You have abdominal pain.  You have vaginal discharge.  You have painful sexual intercourse.  You are a woman and have bleeding between periods and after sex. SEEK IMMEDIATE MEDICAL CARE IF:   You have a fever or persistent symptoms for more than 2 3 days.  You have a fever and your symptoms suddenly get worse.  You experience nausea or vomiting.  You experience excessive sweating (diaphoresis).  You have difficulty swallowing. MAKE SURE YOU:   Understand these instructions.  Will watch your condition.  Will get help right away if you are not doing well or get worse. Document Released: 02/16/2005 Document Revised: 02/27/2013 Document Reviewed: 01/14/2013 [facility] Patient Information 2014 [place].  Bacterial Vaginosis Bacterial vaginosis is a vaginal infection that occurs when the normal balance of bacteria in the vagina is disrupted. It results from an overgrowth of certain bacteria. This is the most common vaginal infection in women of childbearing age. Treatment is important to prevent complications, especially in pregnant women, as it can cause a premature delivery. CAUSES  Bacterial vaginosis is caused by an increase in harmful bacteria that are normally present in smaller amounts in the vagina. Several different kinds of bacteria can cause bacterial vaginosis. However,  the reason that the condition develops is not fully understood. RISK FACTORS Certain activities or behaviors can put you at an increased risk of developing bacterial vaginosis, including:  Having a new sex partner or multiple sex  partners.  Douching.  Using an intrauterine device (IUD) for contraception. Women do not get bacterial vaginosis from toilet seats, bedding, swimming pools, or contact with objects around them. SIGNS AND SYMPTOMS  Some women with bacterial vaginosis have no signs or symptoms. Common symptoms include:  Grey vaginal discharge.  A fishlike odor with discharge, especially after sexual intercourse.  Itching or burning of the vagina and vulva.  Burning or pain with urination. DIAGNOSIS  Your health care provider will take a medical history and examine the vagina for signs of bacterial vaginosis. A sample of vaginal fluid may be taken. Your health care provider will look at this sample under a microscope to check for bacteria and abnormal cells. A vaginal pH test may also be done.  TREATMENT  Bacterial vaginosis may be treated with antibiotic medicines. These may be given in the form of a pill or a vaginal cream. A second round of antibiotics may be prescribed if the condition comes back after treatment.  HOME CARE INSTRUCTIONS   Only take over-the-counter or prescription medicines as directed by your health care provider.  If antibiotic medicine was prescribed, take it as directed. Make sure you finish it even if you start to feel better.  Do not have sex until treatment is completed.  Tell all sexual partners that you have a vaginal infection. They should see their health care provider and be treated if they have problems, such as a mild rash or itching.  Practice safe sex by using condoms and only having one sex partner. SEEK MEDICAL CARE IF:   Your symptoms are not improving after 3 days of treatment.  You have increased discharge or pain.  You have a fever. MAKE SURE YOU:   Understand these instructions.  Will watch your condition.  Will get help right away if you are not doing well or get worse. FOR MORE INFORMATION  Centers for Disease Control and Prevention, Division of  STD Prevention: [employer] American Sexual Health Association (ASHA): www.ashastd.org  Document Released: 05/09/2005 Document Revised: 02/27/2013 Document Reviewed: 12/19/2012 [facility] Patient Information 2014 [place].

## 2013-09-06 LAB — GC/CHLAMYDIA PROBE AMP
CT Probe RNA: NEGATIVE
GC PROBE AMP APTIMA: NEGATIVE

## 2013-10-19 ENCOUNTER — Encounter (HOSPITAL_BASED_OUTPATIENT_CLINIC_OR_DEPARTMENT_OTHER): Payer: Self-pay | Admitting: Emergency Medicine

## 2013-10-19 ENCOUNTER — Emergency Department (HOSPITAL_BASED_OUTPATIENT_CLINIC_OR_DEPARTMENT_OTHER)
Admission: EM | Admit: 2013-10-19 | Discharge: 2013-10-19 | Disposition: A | Discharge status: Home / Self Care | Payer: 59 | Attending: Emergency Medicine | Admitting: Emergency Medicine

## 2013-10-19 DIAGNOSIS — Z791 Long term (current) use of non-steroidal anti-inflammatories (NSAID): Secondary | ICD-10-CM | POA: Insufficient documentation

## 2013-10-19 DIAGNOSIS — Z3202 Encounter for pregnancy test, result negative: Secondary | ICD-10-CM | POA: Insufficient documentation

## 2013-10-19 DIAGNOSIS — F17200 Nicotine dependence, unspecified, uncomplicated: Secondary | ICD-10-CM | POA: Insufficient documentation

## 2013-10-19 DIAGNOSIS — Z792 Long term (current) use of antibiotics: Secondary | ICD-10-CM | POA: Insufficient documentation

## 2013-10-19 DIAGNOSIS — K219 Gastro-esophageal reflux disease without esophagitis: Secondary | ICD-10-CM | POA: Insufficient documentation

## 2013-10-19 LAB — URINALYSIS, ROUTINE W REFLEX MICROSCOPIC
Bilirubin Urine: NEGATIVE
GLUCOSE, UA: NEGATIVE mg/dL
HGB URINE DIPSTICK: NEGATIVE
KETONES UR: NEGATIVE mg/dL
Leukocytes, UA: NEGATIVE
Nitrite: NEGATIVE
Protein, ur: NEGATIVE mg/dL
Specific Gravity, Urine: 1.025 (ref 1.005–1.030)
Urobilinogen, UA: 1 mg/dL (ref 0.0–1.0)
pH: 7 (ref 5.0–8.0)

## 2013-10-19 LAB — PREGNANCY, URINE: PREG TEST UR: NEGATIVE

## 2013-10-19 MED ORDER — RANITIDINE HCL 150 MG PO TABS: 150.0000 mg | ORAL_TABLET | Freq: Two times a day (BID) | ORAL | Status: DC

## 2013-10-19 MED ORDER — GI COCKTAIL ~~LOC~~
30.0000 mL | Freq: Once | ORAL | Status: AC
  Administered 2013-10-19: 30 mL via ORAL

## 2013-10-19 MED ORDER — GI COCKTAIL ~~LOC~~
ORAL | Status: AC
  Administered 2013-10-19: 30 mL via ORAL
  Filled 2013-10-19: qty 30

## 2013-10-19 NOTE — ED Provider Notes (Addendum)
CSN: [id number]     Arrival date & time 10/19/13  1245 History   None    Chief Complaint  Patient presents with  . Abdominal Pain     (Consider location/radiation/quality/duration/timing/severity/associated sxs/prior Treatment) Patient is a 23 y.o. female presenting with abdominal pain. The history is provided by the patient.  Abdominal Pain Pain location:  Epigastric Pain quality: burning and gnawing   Pain radiates to:  Does not radiate Pain severity:  Moderate Onset quality:  Gradual Duration:  3 days Timing:  Constant Progression:  Worsening Chronicity:  New Context: eating and recent illness   Context: not sick contacts and not suspicious food intake   Context comment:  Started taking dayquil for allergies. but pain is worse after eating and acidy taste when getting up in the morning Relieved by:  None tried Worsened by:  Eating (lying down) Ineffective treatments:  None tried Associated symptoms: cough   Associated symptoms: no anorexia, no chest pain, no diarrhea, no dysuria, no nausea, no shortness of breath and no vomiting   Risk factors: NSAID use   Risk factors: not pregnant     History reviewed. No pertinent past medical history. Past Surgical History  Procedure Laterality Date  . Cesarean section     No family history on file. History  Substance Use Topics  . Smoking status: Current Every Day Smoker -- 0.50 packs/day    Types: Cigarettes  . Smokeless tobacco: Not on file  . Alcohol Use: Yes   OB History   Grav Para Term Preterm Abortions TAB SAB Ect Mult Living   1 1             Review of Systems  Respiratory: Positive for cough. Negative for shortness of breath.   Cardiovascular: Negative for chest pain.  Gastrointestinal: Positive for abdominal pain. Negative for nausea, vomiting, diarrhea and anorexia.  Genitourinary: Negative for dysuria.  All other systems reviewed and are negative.     Allergies  Review of patient's allergies indicates  no known allergies.  Home Medications   Prior to Admission medications   Medication Sig Start Date End Date Taking? Authorizing Provider  ibuprofen (ADVIL,MOTRIN) 800 MG tablet Take 1 tablet (800 mg total) by mouth 3 (three) times daily. 08/30/13   Shari A Upstill, PA-C  Levonorgestrel-Ethinyl Estrad (KURVELO PO) Take by mouth.    Historical Provider, MD  metroNIDAZOLE (FLAGYL) 500 MG tablet Take 1 tablet (500 mg total) by mouth 2 (two) times daily. One po bid x 7 days 09/05/13   Veryl Speak, MD  UNKNOWN TO PATIENT Birth control pills    Historical Provider, MD   BP 130/69  Pulse 75  Temp(Src) 98.6 F (37 C)  Resp 18  SpO2 100% Physical Exam  Nursing note and vitals reviewed. Constitutional: She is oriented to person, place, and time. She appears well-developed and well-nourished. No distress.  HENT:  Head: Normocephalic and atraumatic.  Right Ear: Tympanic membrane normal.  Left Ear: Tympanic membrane normal.  Nose: Mucosal edema present.  Mouth/Throat: Oropharynx is clear and moist and mucous membranes are normal.  Eyes: Conjunctivae and EOM are normal. Pupils are equal, round, and reactive to light.  Neck: Normal range of motion. Neck supple.  Cardiovascular: Normal rate, regular rhythm and intact distal pulses.   No murmur heard. Pulmonary/Chest: Effort normal and breath sounds normal. No respiratory distress. She has no wheezes. She has no rales.  Abdominal: Soft. She exhibits no distension. There is tenderness in the epigastric area. There  is no rebound and no guarding.  Mild epigastric tenderness  Musculoskeletal: Normal range of motion. She exhibits no edema and no tenderness.  Neurological: She is alert and oriented to person, place, and time.  Skin: Skin is warm and dry. No rash noted. No erythema.  Psychiatric: She has a normal mood and affect. Her behavior is normal.    ED Course  Procedures (including critical care time) Labs Review Labs Reviewed  URINALYSIS,  [place], URINE    Imaging Review No results found.   EKG Interpretation None      MDM   Final diagnoses:  GERD (gastroesophageal reflux disease)    Patient with symptoms consistent with reflux. Her abdomen is benign she is well-appearing and has normal vital signs.  The last 2 days she's been taking DayQuil for her allergies and her symptoms started after eating some [place] food. She denies any vomiting or diarrhea. Feel most likely to take oral insets is irritating her stomach. She is improved with a GI cocktail. UA and UPT are within normal limits. Will discharge home with Zantac   Blanchie Dessert, MD 10/19/13 [id number]  Blanchie Dessert, MD 10/19/13 0900

## 2013-10-19 NOTE — ED Notes (Signed)
Patient states for the past two days mid upper abd pain, hurts when she swallows and after she eats.

## 2013-10-19 NOTE — Discharge Instructions (Signed)

## 2014-03-24 ENCOUNTER — Encounter (HOSPITAL_BASED_OUTPATIENT_CLINIC_OR_DEPARTMENT_OTHER): Payer: Self-pay | Admitting: Emergency Medicine

## 2014-05-17 ENCOUNTER — Encounter (HOSPITAL_BASED_OUTPATIENT_CLINIC_OR_DEPARTMENT_OTHER): Payer: Self-pay

## 2014-05-17 ENCOUNTER — Emergency Department (HOSPITAL_BASED_OUTPATIENT_CLINIC_OR_DEPARTMENT_OTHER)
Admission: EM | Admit: 2014-05-17 | Discharge: 2014-05-17 | Disposition: A | Discharge status: Home / Self Care | Payer: 59 | Attending: Emergency Medicine | Admitting: Emergency Medicine

## 2014-05-17 DIAGNOSIS — R1011 Right upper quadrant pain: Secondary | ICD-10-CM

## 2014-05-17 DIAGNOSIS — Z720 Tobacco use: Secondary | ICD-10-CM | POA: Diagnosis not present

## 2014-05-17 DIAGNOSIS — Z3202 Encounter for pregnancy test, result negative: Secondary | ICD-10-CM | POA: Insufficient documentation

## 2014-05-17 LAB — PREGNANCY, URINE: Preg Test, Ur: NEGATIVE

## 2014-05-17 LAB — CBC WITH DIFFERENTIAL/PLATELET
Basophils Absolute: 0 10*3/uL (ref 0.0–0.1)
Basophils Relative: 0 % (ref 0–1)
Eosinophils Absolute: 0.2 10*3/uL (ref 0.0–0.7)
Eosinophils Relative: 3 % (ref 0–5)
HCT: 38.5 % (ref 36.0–46.0)
HEMOGLOBIN: 13.1 g/dL (ref 12.0–15.0)
LYMPHS ABS: 4.1 10*3/uL — AB (ref 0.7–4.0)
LYMPHS PCT: 45 % (ref 12–46)
MCH: 33.8 pg (ref 26.0–34.0)
MCHC: 34 g/dL (ref 30.0–36.0)
MCV: 99.2 fL (ref 78.0–100.0)
MONOS PCT: 10 % (ref 3–12)
Monocytes Absolute: 0.9 10*3/uL (ref 0.1–1.0)
NEUTROS PCT: 42 % — AB (ref 43–77)
Neutro Abs: 3.9 10*3/uL (ref 1.7–7.7)
PLATELETS: 311 10*3/uL (ref 150–400)
RBC: 3.88 MIL/uL (ref 3.87–5.11)
RDW: 12.8 % (ref 11.5–15.5)
WBC: 9.2 10*3/uL (ref 4.0–10.5)

## 2014-05-17 LAB — COMPREHENSIVE METABOLIC PANEL
ALT: 9 U/L (ref 0–35)
AST: 15 U/L (ref 0–37)
Albumin: 3.7 g/dL (ref 3.5–5.2)
Alkaline Phosphatase: 41 U/L (ref 39–117)
Anion gap: 6 (ref 5–15)
BILIRUBIN TOTAL: 0.3 mg/dL (ref 0.3–1.2)
BUN: 12 mg/dL (ref 6–23)
CO2: 25 mmol/L (ref 19–32)
Calcium: 9.1 mg/dL (ref 8.4–10.5)
Chloride: 109 mEq/L (ref 96–112)
Creatinine, Ser: 0.83 mg/dL (ref 0.50–1.10)
GFR calc Af Amer: >90 mL/min (ref >90)
GLUCOSE: 95 mg/dL (ref 70–99)
Potassium: 3.8 mmol/L (ref 3.5–5.1)
SODIUM: 140 mmol/L (ref 135–145)
Total Protein: 6.4 g/dL (ref 6.0–8.3)

## 2014-05-17 LAB — URINALYSIS, ROUTINE W REFLEX MICROSCOPIC
BILIRUBIN URINE: NEGATIVE
GLUCOSE, UA: NEGATIVE mg/dL
Hgb urine dipstick: NEGATIVE
Ketones, ur: 15 mg/dL — AB
Leukocytes, UA: NEGATIVE
NITRITE: NEGATIVE
Protein, ur: NEGATIVE mg/dL
SPECIFIC GRAVITY, URINE: 1.028 (ref 1.005–1.030)
Urobilinogen, UA: 0.2 mg/dL (ref 0.0–1.0)
pH: 6.5 (ref 5.0–8.0)

## 2014-05-17 LAB — LIPASE, BLOOD: LIPASE: 25 U/L (ref 11–59)

## 2014-05-17 MED ORDER — ONDANSETRON HCL 4 MG/2ML IJ SOLN
4.0000 mg | Freq: Once | INTRAMUSCULAR | Status: AC
  Administered 2014-05-17: 4 mg via INTRAVENOUS
  Filled 2014-05-17: qty 2

## 2014-05-17 MED ORDER — PANTOPRAZOLE SODIUM 40 MG IV SOLR
40.0000 mg | Freq: Once | INTRAVENOUS | Status: AC
  Administered 2014-05-17: 40 mg via INTRAVENOUS
  Filled 2014-05-17: qty 40

## 2014-05-17 MED ORDER — MORPHINE SULFATE 4 MG/ML IJ SOLN
4.0000 mg | Freq: Once | INTRAMUSCULAR | Status: AC
  Administered 2014-05-17: 4 mg via INTRAVENOUS
  Filled 2014-05-17: qty 1

## 2014-05-17 MED ORDER — OXYCODONE-ACETAMINOPHEN 5-325 MG PO TABS: 1.0000 | ORAL_TABLET | ORAL | Status: DC | PRN

## 2014-05-17 NOTE — Discharge Instructions (Signed)

## 2014-05-17 NOTE — ED Notes (Signed)
Patient here with right sided umbilicus pain since 35LE last night. Denies any associated symptoms with same. Pain has been persistent.

## 2014-05-17 NOTE — ED Notes (Signed)
MD at bedside. 

## 2014-05-17 NOTE — ED Provider Notes (Signed)
CSN: [id number]     Arrival date & time 05/17/14  6256 History   First MD Initiated Contact with Patient 05/17/14 [phone]     Chief Complaint  Patient presents with  . Abdominal Pain      Patient is a 23 y.o. female presenting with abdominal pain. The history is provided by the patient.  Abdominal Pain Pain location:  RUQ Pain quality: sharp   Pain radiates to:  Does not radiate Pain severity:  Moderate Onset quality:  Sudden Duration:  10 hours Timing:  Constant Progression:  Unchanged Chronicity:  New Relieved by:  Nothing Worsened by:  Nothing tried Associated symptoms: no chest pain, no diarrhea, no dysuria, no fever, no vaginal bleeding, no vaginal discharge and no vomiting   Patient reports onset of abd pain last night - it occurred about 2 hrs after eating a heavy/greasy meal for the holiday She has never had this before It kept her awake all night   PMH - none  Past Surgical History  Procedure Laterality Date  . Cesarean section     No family history on file. History  Substance Use Topics  . Smoking status: Current Every Day Smoker -- 0.50 packs/day    Types: Cigarettes  . Smokeless tobacco: Not on file  . Alcohol Use: Yes   OB History    Gravida Para Term Preterm AB TAB SAB Ectopic Multiple Living   1 1             Review of Systems  Constitutional: Negative for fever.  Cardiovascular: Negative for chest pain.  Gastrointestinal: Positive for abdominal pain. Negative for vomiting and diarrhea.  Genitourinary: Negative for dysuria, vaginal bleeding and vaginal discharge.  All other systems reviewed and are negative.     Allergies  Review of patient's allergies indicates no known allergies.  Home Medications   Prior to Admission medications   Medication Sig Start Date End Date Taking? Authorizing Provider  Levonorgestrel-Ethinyl Estrad (KURVELO PO) Take by mouth.    Historical Provider, MD   BP 129/60 mmHg  Pulse 69  Temp(Src) 98.4 F (36.9 C)  (Oral)  Resp 18  SpO2 100%  LMP 04/26/2014 Physical Exam CONSTITUTIONAL: Well developed/well nourished HEAD: Normocephalic/atraumatic EYES: EOMI/PERRL, no icterus ENMT: Mucous membranes moist NECK: supple no meningeal signs SPINE/BACK:entire spine nontender CV: S1/S2 noted, no murmurs/rubs/gallops noted LUNGS: Lungs are clear to auscultation bilaterally, no apparent distress ABDOMEN: soft, moderate RUQ tenderness noted, no rebound or guarding, bowel sounds noted throughout abdomen GU:no cva tenderness NEURO: Pt is awake/alert/appropriate, moves all extremitiesx4.  No facial droop.   EXTREMITIES: pulses normal/equal, full ROM SKIN: warm, color normal PSYCH: no abnormalities of mood noted, alert and oriented to situation  ED Course  Procedures   10:20 AM Pt improved She is in no distress She never had low abd tenderness, defer pelvic exam for now She has no focal tenderness on repeat exam Biliary colic is possible, though no signs of acute cholecystitis at this time Discussed need for outpatient evaluation next week and will need outpatient US imaging We discussed strict return precautions BP 102/59 mmHg  Pulse 100  Temp(Src) 98.4 F (36.9 C) (Oral)  Resp 16  SpO2 99%  LMP 04/26/2014  Labs Review Labs Reviewed  URINALYSIS, ROUTINE W REFLEX MICROSCOPIC - Abnormal; Notable for the following:    Ketones, ur 15 (*)    All other components within normal limits  CBC WITH DIFFERENTIAL - Abnormal; Notable for the following:    Neutrophils Relative %  42 (*)    Lymphs Abs 4.1 (*)    All other components within normal limits  PREGNANCY, URINE  LIPASE, BLOOD  COMPREHENSIVE METABOLIC PANEL   Medications  ondansetron (ZOFRAN) injection 4 mg (4 mg Intravenous Given 05/17/14 0835)  morphine 4 MG/ML injection 4 mg (4 mg Intravenous Given 05/17/14 0835)  pantoprazole (PROTONIX) injection 40 mg (40 mg Intravenous Given 05/17/14 0835)     MDM   Final diagnoses:  Right upper  quadrant pain    Nursing notes including past medical history and social history reviewed and considered in documentation Labs/vital reviewed myself and considered during evaluation     Sharyon Cable, MD 05/17/14 1021

## 2014-07-26 ENCOUNTER — Encounter (HOSPITAL_BASED_OUTPATIENT_CLINIC_OR_DEPARTMENT_OTHER): Payer: Self-pay

## 2014-07-26 ENCOUNTER — Emergency Department (HOSPITAL_BASED_OUTPATIENT_CLINIC_OR_DEPARTMENT_OTHER): Payer: No Typology Code available for payment source

## 2014-07-26 DIAGNOSIS — Z720 Tobacco use: Secondary | ICD-10-CM | POA: Insufficient documentation

## 2014-07-26 DIAGNOSIS — Z793 Long term (current) use of hormonal contraceptives: Secondary | ICD-10-CM | POA: Insufficient documentation

## 2014-07-26 DIAGNOSIS — J069 Acute upper respiratory infection, unspecified: Secondary | ICD-10-CM | POA: Diagnosis not present

## 2014-07-26 DIAGNOSIS — Z3202 Encounter for pregnancy test, result negative: Secondary | ICD-10-CM | POA: Insufficient documentation

## 2014-07-26 DIAGNOSIS — J029 Acute pharyngitis, unspecified: Secondary | ICD-10-CM | POA: Diagnosis present

## 2014-07-26 LAB — URINALYSIS, ROUTINE W REFLEX MICROSCOPIC
BILIRUBIN URINE: NEGATIVE
Glucose, UA: NEGATIVE mg/dL
Hgb urine dipstick: NEGATIVE
Ketones, ur: 15 mg/dL — AB
Leukocytes, UA: NEGATIVE
NITRITE: NEGATIVE
Protein, ur: NEGATIVE mg/dL
Specific Gravity, Urine: 1.028 (ref 1.005–1.030)
UROBILINOGEN UA: 1 mg/dL (ref 0.0–1.0)
pH: 6.5 (ref 5.0–8.0)

## 2014-07-26 LAB — PREGNANCY, URINE: PREG TEST UR: NEGATIVE

## 2014-07-26 MED ORDER — ACETAMINOPHEN 325 MG PO TABS
650.0000 mg | ORAL_TABLET | Freq: Once | ORAL | Status: AC
  Administered 2014-07-26: 650 mg via ORAL
  Filled 2014-07-26: qty 2

## 2014-07-26 NOTE — ED Notes (Signed)
Pt reports 3 days of cough/congestion, fever, posttussive emesis.  Reports unable to take deep breath d/t congestion.  Lungs clear bilateral and not having difficulty talking in full sentences.  Last tylenol @ 1500, no motrin taken.

## 2014-07-27 ENCOUNTER — Encounter (HOSPITAL_BASED_OUTPATIENT_CLINIC_OR_DEPARTMENT_OTHER): Payer: Self-pay | Admitting: Emergency Medicine

## 2014-07-27 ENCOUNTER — Emergency Department (HOSPITAL_BASED_OUTPATIENT_CLINIC_OR_DEPARTMENT_OTHER)
Admission: EM | Admit: 2014-07-27 | Discharge: 2014-07-27 | Disposition: A | Discharge status: Home / Self Care | Payer: No Typology Code available for payment source | Attending: Emergency Medicine | Admitting: Emergency Medicine

## 2014-07-27 DIAGNOSIS — J069 Acute upper respiratory infection, unspecified: Secondary | ICD-10-CM | POA: Diagnosis not present

## 2014-07-27 MED ORDER — LORATADINE 10 MG PO TABS
10.0000 mg | ORAL_TABLET | Freq: Once | ORAL | Status: AC
  Administered 2014-07-27: 10 mg via ORAL
  Filled 2014-07-27: qty 1

## 2014-07-27 MED ORDER — CETIRIZINE-PSEUDOEPHEDRINE ER 5-120 MG PO TB12: 1.0000 | ORAL_TABLET | Freq: Every day | ORAL | Status: DC

## 2014-07-27 NOTE — ED Notes (Signed)
Alert, NAD, calm, interactive, resps e/u, no dyspnea noted, c/o cough (dry), sore throat from coughing, fever & chest congestion (denies: nvd, dizziness, earache, or nasal congestion). Has been taking mucinex w/o mucus production.

## 2014-07-27 NOTE — ED Notes (Signed)
Up to b/r, steady gait 

## 2014-07-27 NOTE — ED Provider Notes (Signed)
CSN: [id number]     Arrival date & time 07/26/14  2152 History   First MD Initiated Contact with Patient 07/27/14 0044     Chief Complaint  Patient presents with  . URI     (Consider location/radiation/quality/duration/timing/severity/associated sxs/prior Treatment) Patient is a 24 y.o. female presenting with URI. The history is provided by the patient.  URI Presenting symptoms: congestion, cough and sore throat   Presenting symptoms: no fever   Congestion:    Location:  Nasal and chest   Interferes with sleep: no     Interferes with eating/drinking: no   Severity:  Moderate Onset quality:  Gradual Duration:  6 hours Timing:  Constant Progression:  Unchanged Chronicity:  New Relieved by:  Nothing Worsened by:  Nothing tried Ineffective treatments: one dose of mucinex just prior to arrival. Associated symptoms: arthralgias   Associated symptoms: no wheezing   Risk factors: not elderly     History reviewed. No pertinent past medical history. Past Surgical History  Procedure Laterality Date  . Cesarean section     No family history on file. History  Substance Use Topics  . Smoking status: Current Every Day Smoker -- 0.25 packs/day    Types: Cigarettes  . Smokeless tobacco: Not on file  . Alcohol Use: Yes     Comment: occ   OB History    Gravida Para Term Preterm AB TAB SAB Ectopic Multiple Living   1 1             Review of Systems  Constitutional: Negative for fever.  HENT: Positive for congestion and sore throat.   Respiratory: Positive for cough. Negative for wheezing.   Cardiovascular: Negative for chest pain, palpitations and leg swelling.  Musculoskeletal: Positive for arthralgias.  All other systems reviewed and are negative.     Allergies  Review of patient's allergies indicates no known allergies.  Home Medications   Prior to Admission medications   Medication Sig Start Date End Date Taking? Authorizing Provider  Levonorgestrel-Ethinyl Estrad  (KURVELO PO) Take by mouth.    Historical Provider, MD  oxyCODONE-acetaminophen (PERCOCET/ROXICET) 5-325 MG per tablet Take 1 tablet by mouth every 4 (four) hours as needed for severe pain. 05/17/14   Sharyon Cable, MD   BP 124/76 mmHg  Pulse 104  Temp(Src) 98.5 F (36.9 C) (Oral)  Resp 18  Ht 5\' 5"  (1.651 m)  Wt 123 lb (55.792 kg)  BMI 20.47 kg/m2  SpO2 100% Physical Exam  Constitutional: She is oriented to person, place, and time. She appears well-developed and well-nourished. No distress.  HENT:  Head: Normocephalic and atraumatic.  Mouth/Throat: Oropharynx is clear and moist. No oropharyngeal exudate.  Eyes: Conjunctivae and EOM are normal. Pupils are equal, round, and reactive to light.  Neck: Normal range of motion. Neck supple.  Cardiovascular: Normal rate, regular rhythm and intact distal pulses.   Pulmonary/Chest: Effort normal and breath sounds normal. No respiratory distress. She has no wheezes. She has no rales.  Abdominal: Soft. Bowel sounds are normal. There is no tenderness. There is no rebound.  Musculoskeletal: Normal range of motion. She exhibits no edema or tenderness.  Lymphadenopathy:    She has no cervical adenopathy.  Neurological: She is alert and oriented to person, place, and time.  Skin: Skin is warm and dry.  Psychiatric: She has a normal mood and affect.    ED Course  Procedures (including critical care time) Labs Review Labs Reviewed  URINALYSIS, ROUTINE W REFLEX MICROSCOPIC - Abnormal;  Notable for the following:    Ketones, ur 15 (*)    All other components within normal limits  PREGNANCY, URINE    Imaging Review Dg Chest 2 View  07/26/2014   CLINICAL DATA:  Initial evaluation for cough and congestion for 3 days  EXAM: CHEST  2 VIEW  COMPARISON:  None.  FINDINGS: The heart size and mediastinal contours are within normal limits. Both lungs are clear. The visualized skeletal structures are unremarkable.  IMPRESSION: No active cardiopulmonary  disease.   Electronically Signed   By: Skipper Cliche M.D.   On: 07/26/2014 23:03     EKG Interpretation None      MDM   Final diagnoses:  None  URI will treat symptomatically     Zyden Suman K Kasch Borquez-Rasch, MD 07/27/14 [phone]

## 2014-07-27 NOTE — Discharge Instructions (Signed)
Cool Mist Vaporizers °Vaporizers may help relieve the symptoms of a cough and cold. They add moisture to the air, which helps mucus to become thinner and less sticky. This makes it easier to breathe and cough up secretions. Cool mist vaporizers do not cause serious burns like hot mist vaporizers, which may also be called steamers or humidifiers. Vaporizers have not been proven to help with colds. You should not use a vaporizer if you are allergic to mold. °HOME CARE INSTRUCTIONS °· Follow the package instructions for the vaporizer. °· Do not use anything other than distilled water in the vaporizer. °· Do not run the vaporizer all of the time. This can cause mold or bacteria to grow in the vaporizer. °· Clean the vaporizer after each time it is used. °· Clean and dry the vaporizer well before storing it. °· Stop using the vaporizer if worsening respiratory symptoms develop. °Document Released: 02/04/2004 Document Revised: 05/14/2013 Document Reviewed: 09/26/2012 °ExitCare® Patient Information ©2015 [employer]. This information is not intended to replace advice given to you by your health care provider. Make sure you discuss any questions you have with your health care provider. ° °

## 2014-07-27 NOTE — ED Notes (Signed)
Denies needs or questions, given Rx x1, steady gait, out with family to d/c desk.

## 2015-01-09 ENCOUNTER — Encounter (HOSPITAL_BASED_OUTPATIENT_CLINIC_OR_DEPARTMENT_OTHER): Payer: Self-pay | Admitting: *Deleted

## 2015-01-09 ENCOUNTER — Emergency Department (HOSPITAL_BASED_OUTPATIENT_CLINIC_OR_DEPARTMENT_OTHER): Payer: 59

## 2015-01-09 ENCOUNTER — Emergency Department (HOSPITAL_BASED_OUTPATIENT_CLINIC_OR_DEPARTMENT_OTHER)
Admission: EM | Admit: 2015-01-09 | Discharge: 2015-01-10 | Disposition: A | Discharge status: Home / Self Care | Payer: 59 | Attending: Emergency Medicine | Admitting: Emergency Medicine

## 2015-01-09 DIAGNOSIS — S4992XA Unspecified injury of left shoulder and upper arm, initial encounter: Secondary | ICD-10-CM | POA: Insufficient documentation

## 2015-01-09 DIAGNOSIS — Y9389 Activity, other specified: Secondary | ICD-10-CM | POA: Diagnosis not present

## 2015-01-09 DIAGNOSIS — Z79899 Other long term (current) drug therapy: Secondary | ICD-10-CM | POA: Diagnosis not present

## 2015-01-09 DIAGNOSIS — Z720 Tobacco use: Secondary | ICD-10-CM | POA: Diagnosis not present

## 2015-01-09 DIAGNOSIS — Y92410 Unspecified street and highway as the place of occurrence of the external cause: Secondary | ICD-10-CM | POA: Diagnosis not present

## 2015-01-09 DIAGNOSIS — Y998 Other external cause status: Secondary | ICD-10-CM | POA: Insufficient documentation

## 2015-01-09 DIAGNOSIS — S24109A Unspecified injury at unspecified level of thoracic spinal cord, initial encounter: Secondary | ICD-10-CM | POA: Diagnosis not present
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
## 2015-01-10 MED ORDER — HYDROCODONE-ACETAMINOPHEN 5-325 MG PO TABS: 1.0000 | ORAL_TABLET | Freq: Four times a day (QID) | ORAL | Status: DC | PRN

## 2015-01-10 NOTE — Discharge Instructions (Signed)

## 2015-11-25 ENCOUNTER — Encounter (HOSPITAL_BASED_OUTPATIENT_CLINIC_OR_DEPARTMENT_OTHER): Payer: Self-pay | Admitting: *Deleted

## 2015-11-25 ENCOUNTER — Emergency Department (HOSPITAL_BASED_OUTPATIENT_CLINIC_OR_DEPARTMENT_OTHER)
Admission: EM | Admit: 2015-11-25 | Discharge: 2015-11-25 | Disposition: A | Discharge status: Home / Self Care | Payer: 59 | Attending: Emergency Medicine | Admitting: Emergency Medicine

## 2015-11-25 DIAGNOSIS — Z79899 Other long term (current) drug therapy: Secondary | ICD-10-CM | POA: Diagnosis not present

## 2015-11-25 DIAGNOSIS — N760 Acute vaginitis: Secondary | ICD-10-CM | POA: Insufficient documentation

## 2015-11-25 DIAGNOSIS — R300 Dysuria: Secondary | ICD-10-CM | POA: Diagnosis present

## 2015-11-25 DIAGNOSIS — F17210 Nicotine dependence, cigarettes, uncomplicated: Secondary | ICD-10-CM | POA: Diagnosis not present

## 2015-11-25 DIAGNOSIS — B9689 Other specified bacterial agents as the cause of diseases classified elsewhere: Secondary | ICD-10-CM

## 2015-11-25 LAB — WET PREP, GENITAL
Sperm: NONE SEEN
Trich, Wet Prep: NONE SEEN
YEAST WET PREP: NONE SEEN

## 2015-11-25 LAB — URINALYSIS, ROUTINE W REFLEX MICROSCOPIC
Bilirubin Urine: NEGATIVE
Glucose, UA: NEGATIVE mg/dL
Hgb urine dipstick: NEGATIVE
Ketones, ur: NEGATIVE mg/dL
NITRITE: NEGATIVE
PH: 6.5 (ref 5.0–8.0)
Protein, ur: NEGATIVE mg/dL
SPECIFIC GRAVITY, URINE: 1.016 (ref 1.005–1.030)

## 2015-11-25 LAB — URINE MICROSCOPIC-ADD ON

## 2015-11-25 LAB — PREGNANCY, URINE: Preg Test, Ur: NEGATIVE

## 2015-11-25 MED ORDER — METRONIDAZOLE 500 MG PO TABS: 500.0000 mg | ORAL_TABLET | Freq: Two times a day (BID) | ORAL | Status: DC

## 2015-11-25 MED ORDER — METRONIDAZOLE 500 MG PO TABS
500.0000 mg | ORAL_TABLET | Freq: Once | ORAL | Status: AC
  Administered 2015-11-25: 500 mg via ORAL
  Filled 2015-11-25: qty 1

## 2015-11-25 NOTE — ED Notes (Signed)
Pt c/o painful freq urination  X 1 day

## 2015-11-25 NOTE — Discharge Instructions (Signed)

## 2015-11-25 NOTE — ED Provider Notes (Signed)
CSN: [id number]     Arrival date & time 11/25/15  1902 History  By signing my name below, I, Soijett Blue, attest that this documentation has been prepared under the direction and in the presence of Isla Pence, MD. Electronically Signed: Soijett Blue, ED Scribe. 11/25/2015. 7:36 PM.  Chief Complaint  Patient presents with  . Dysuria      The history is provided by the patient. No language interpreter was used.    HPI Comments: Heather Sharp is a 25 y.o. female who presents to the Emergency Department complaining of dysuria onset yesterday. Denies having a bladder infection in the past. She states that she is having associated symptoms of right flank pain, urinary frequency, and hematuria x 1 day. She states that she has not tried any medications for the relief for her symptoms. She denies vaginal discharge, fever, chills, and any other symptoms.   History reviewed. No pertinent past medical history. Past Surgical History  Procedure Laterality Date  . Cesarean section    . Induced abortion     History reviewed. No pertinent family history. Social History  Substance Use Topics  . Smoking status: Current Every Day Smoker -- 0.25 packs/day    Types: Cigarettes  . Smokeless tobacco: None  . Alcohol Use: Yes     Comment: occ   OB History    Gravida Para Term Preterm AB TAB SAB Ectopic Multiple Living   2 1             Review of Systems  Constitutional: Negative for fever and chills.  Genitourinary: Positive for dysuria, frequency, hematuria and flank pain (right). Negative for vaginal discharge.      Allergies  Review of patient's allergies indicates no known allergies.  Home Medications   Prior to Admission medications   Medication Sig Start Date End Date Taking? Authorizing Provider  levonorgestrel-ethinyl estradiol (NORDETTE) 0.15-30 MG-MCG tablet Take 1 tablet by mouth daily.   Yes Historical Provider, MD  metroNIDAZOLE (FLAGYL) 500 MG tablet Take 1 tablet (500 mg  total) by mouth 2 (two) times daily. 11/25/15   Isla Pence, MD   BP 141/79 mmHg  Pulse 73  Temp(Src) 98.6 F (37 C) (Oral)  Resp 18  Ht 5\' 5"  (1.651 m)  Wt 130 lb (58.968 kg)  BMI 21.63 kg/m2  SpO2 100%  LMP 10/16/2014  Breastfeeding? Unknown Physical Exam  Constitutional: She is oriented to person, place, and time. She appears well-developed and well-nourished. No distress.  HENT:  Head: Normocephalic and atraumatic.  Eyes: EOM are normal.  Neck: Neck supple.  Cardiovascular: Normal rate, regular rhythm and normal heart sounds.  Exam reveals no gallop and no friction rub.   No murmur heard. Pulmonary/Chest: Effort normal and breath sounds normal. No respiratory distress. She has no wheezes. She has no rales.  Abdominal: Soft. She exhibits no distension. There is tenderness in the suprapubic area.  Suprapubic tenderness  Genitourinary: Cervix exhibits discharge. Right adnexum displays no mass, no tenderness and no fullness. Left adnexum displays no mass, no tenderness and no fullness. Vaginal discharge found.  Chaperone present for exam.   Musculoskeletal: Normal range of motion.  Neurological: She is alert and oriented to person, place, and time.  Skin: Skin is warm and dry.  Psychiatric: She has a normal mood and affect. Her behavior is normal.  Nursing note and vitals reviewed.   ED Course  Procedures (including critical care time) DIAGNOSTIC STUDIES: Oxygen Saturation is 100% on RA, nl by my interpretation.  COORDINATION OF CARE: 7:36 PM Discussed treatment plan with pt at bedside which includes UA and pt agreed to plan.    Labs Review Labs Reviewed  WET PREP, GENITAL - Abnormal; Notable for the following:    Clue Cells Wet Prep HPF POC PRESENT (*)    WBC, Wet Prep HPF POC FEW (*)    All other components within normal limits  URINALYSIS, ROUTINE W REFLEX MICROSCOPIC (NOT AT [facility]) - Abnormal; Notable for the following:    APPearance CLOUDY (*)    Leukocytes,  UA SMALL (*)    All other components within normal limits  URINE MICROSCOPIC-ADD ON - Abnormal; Notable for the following:    Squamous Epithelial / LPF 0-5 (*)    Bacteria, UA MANY (*)    All other components within normal limits  PREGNANCY, URINE  GC/CHLAMYDIA PROBE AMP ([place]) NOT AT [facility]    I have personally reviewed and evaluated these lab results as part of my medical decision-making.    MDM  The bacteria in pt's urine are likely from the [place].  Pt will be treated with flagyl.  She is told that all sexual partners need to be treated. Final diagnoses:  Bacterial vaginosis   I personally performed the services described in this documentation, which was scribed in my presence. The recorded information has been reviewed and is accurate.   Isla Pence, MD 11/25/15 2040

## 2015-11-26 LAB — GC/CHLAMYDIA PROBE AMP (~~LOC~~) NOT AT ARMC
CHLAMYDIA, DNA PROBE: NEGATIVE
NEISSERIA GONORRHEA: NEGATIVE

## 2016-07-28 ENCOUNTER — Emergency Department (HOSPITAL_BASED_OUTPATIENT_CLINIC_OR_DEPARTMENT_OTHER)
Admission: EM | Admit: 2016-07-28 | Discharge: 2016-07-28 | Disposition: A | Discharge status: Home / Self Care | Payer: Commercial Managed Care - PPO | Attending: Emergency Medicine | Admitting: Emergency Medicine

## 2016-07-28 ENCOUNTER — Encounter (HOSPITAL_BASED_OUTPATIENT_CLINIC_OR_DEPARTMENT_OTHER): Payer: Self-pay | Admitting: Emergency Medicine

## 2016-07-28 DIAGNOSIS — N390 Urinary tract infection, site not specified: Secondary | ICD-10-CM | POA: Diagnosis not present

## 2016-07-28 DIAGNOSIS — F17210 Nicotine dependence, cigarettes, uncomplicated: Secondary | ICD-10-CM | POA: Diagnosis not present

## 2016-07-28 DIAGNOSIS — R319 Hematuria, unspecified: Secondary | ICD-10-CM

## 2016-07-28 DIAGNOSIS — R300 Dysuria: Secondary | ICD-10-CM | POA: Diagnosis present

## 2016-07-28 LAB — URINALYSIS, MICROSCOPIC (REFLEX)

## 2016-07-28 LAB — URINALYSIS, ROUTINE W REFLEX MICROSCOPIC
Bilirubin Urine: NEGATIVE
GLUCOSE, UA: NEGATIVE mg/dL
Ketones, ur: NEGATIVE mg/dL
Nitrite: NEGATIVE
PROTEIN: NEGATIVE mg/dL
Specific Gravity, Urine: 1.008 (ref 1.005–1.030)
pH: 7 (ref 5.0–8.0)

## 2016-07-28 LAB — PREGNANCY, URINE: PREG TEST UR: NEGATIVE

## 2016-07-28 MED ORDER — CEPHALEXIN 500 MG PO CAPS: 500.0000 mg | ORAL_CAPSULE | Freq: Three times a day (TID) | ORAL | 0 refills | Status: AC

## 2016-07-28 NOTE — ED Triage Notes (Addendum)
Patient states that she has a right lower pelvic pain - denies any N/V/D  - patient states that this feels like the last time she was here for a UTI - the patient also reports burning with urination and increase in frequency

## 2016-07-28 NOTE — ED Provider Notes (Signed)
[place] DEPT [facility] Provider Note   CSN: [id number] Arrival date & time: 07/28/16  1853  By signing my name below, I, Jaquelyn Bitter., attest that this documentation has been prepared under the direction and in the presence of Leo Grosser, MD. Electronically signed: Jaquelyn Bitter., ED Scribe. 07/28/16. 7:34 PM.   History   Chief Complaint Chief Complaint  Patient presents with  . Pelvic Pain    HPI Heather Sharp is a 26 y.o. female who presents to the Emergency Department complaining of constant mild abdominal pain with sudden onset x1 day. Pt states that she awoke from sleeping last night with a sharp, R sided abdominal pain. She reports dysuria. Pt denies any modifying factors. She denies frequency, urgency, vaginal discharge/bleeding, nausea, vomiting, diarrhea. Of note, pt has hx of UTI x1 year ago.   HPI  History reviewed. No pertinent past medical history.  There are no active problems to display for this patient.   Past Surgical History:  Procedure Laterality Date  . CESAREAN SECTION    . INDUCED ABORTION      OB History    Gravida Para Term Preterm AB Living   2 1           SAB TAB Ectopic Multiple Live Births                   Home Medications    Prior to Admission medications   Medication Sig Start Date End Date Taking? Authorizing Provider  levonorgestrel-ethinyl estradiol (NORDETTE) 0.15-30 MG-MCG tablet Take 1 tablet by mouth daily.    Historical Provider, MD  metroNIDAZOLE (FLAGYL) 500 MG tablet Take 1 tablet (500 mg total) by mouth 2 (two) times daily. 11/25/15   Isla Pence, MD    Family History History reviewed. No pertinent family history.  Social History Social History  Substance Use Topics  . Smoking status: Current Every Day Smoker    Packs/day: 0.25    Types: Cigarettes  . Smokeless tobacco: Never Used  . Alcohol use Yes     Comment: occ     Allergies   Patient has no known allergies.   Review of  Systems Review of Systems  Gastrointestinal: Negative for abdominal pain, constipation, diarrhea, nausea and vomiting.  Genitourinary: Positive for dysuria. Negative for frequency, urgency, vaginal bleeding and vaginal discharge.  All other systems reviewed and are negative.    Physical Exam Updated Vital Signs BP 134/82 (BP Location: Left Arm)   Pulse 72   Temp 99.1 F (37.3 C) (Oral)   Resp 18   Ht 5\' 5"  (1.651 m)   Wt 125 lb (56.7 kg)   LMP 07/21/2016   SpO2 100%   BMI 20.80 kg/m   Physical Exam  Constitutional: She is oriented to person, place, and time. She appears well-developed and well-nourished. No distress.  HENT:  Head: Normocephalic.  Nose: Nose normal.  Eyes: Conjunctivae are normal.  Neck: Neck supple. No tracheal deviation present.  Cardiovascular: Normal rate and regular rhythm.   Pulmonary/Chest: Effort normal. No respiratory distress.  Abdominal: Soft. She exhibits no distension. There is no tenderness. There is no rebound and no guarding.  Neurological: She is alert and oriented to person, place, and time.  Skin: Skin is warm and dry.  Psychiatric: She has a normal mood and affect.     ED Treatments / Results   DIAGNOSTIC STUDIES: Oxygen Saturation is 100% on RA, normal by my interpretation.   COORDINATION OF CARE: 7:34  PM-Discussed next steps with pt. Pt verbalized understanding and is agreeable with the plan.    Labs (all labs ordered are listed, but only abnormal results are displayed) Labs Reviewed  URINALYSIS, ROUTINE W REFLEX MICROSCOPIC - Abnormal; Notable for the following:       Result Value   Hgb urine dipstick MODERATE (*)    Leukocytes, UA SMALL (*)    All other components within normal limits  URINALYSIS, MICROSCOPIC (REFLEX) - Abnormal; Notable for the following:    Bacteria, UA FEW (*)    Squamous Epithelial / LPF 0-5 (*)    All other components within normal limits  URINE CULTURE  PREGNANCY, URINE    EKG  EKG  Interpretation None       Radiology No results found.  Procedures Procedures (including critical care time)  Medications Ordered in ED Medications - No data to display   Initial Impression / Assessment and Plan / ED Course  I have reviewed the triage vital signs and the nursing notes.  Pertinent labs & imaging results that were available during my care of the patient were reviewed by me and considered in my medical decision making (see chart for details).     26 y.o. female presents with dysuria and abdominal pain c/w prior UTI. Well appearing, not septic. Leukocytes and bacteria with blood noted in urine. Doubt stone clinically. Will treat empirically with keflex pending culture. Plan to follow up with PCP as needed and return precautions discussed for worsening or new concerning symptoms.   Final Clinical Impressions(s) / ED Diagnoses   Final diagnoses:  Urinary tract infection with hematuria, site unspecified    New Prescriptions Discharge Medication List as of 07/28/2016  7:36 PM    START taking these medications   Details  cephALEXin (KEFLEX) 500 MG capsule Take 1 capsule (500 mg total) by mouth 3 (three) times daily., Starting Thu 07/28/2016, Until Thu 08/04/2016, Print       I personally performed the services described in this documentation, which was scribed in my presence. The recorded information has been reviewed and is accurate.      Leo Grosser, MD 07/28/16 [phone]

## 2016-07-28 NOTE — ED Notes (Signed)
Pt verbalizes understanding of d/c instructions and denies any further needs at this time. 

## 2016-07-31 LAB — URINE CULTURE

## 2016-08-01 ENCOUNTER — Telehealth: Payer: Self-pay | Admitting: Emergency Medicine

## 2016-08-01 NOTE — Telephone Encounter (Signed)
Post ED Visit - Positive Culture Follow-up  Culture report reviewed by antimicrobial stewardship pharmacist:  [x]  Elenor Quinones, Pharm.D. []  Heide Guile, Pharm.D., BCPS []  Parks Neptune, Pharm.D. []  Alycia Rossetti, Pharm.D., BCPS []  [place], [place].D., BCPS, AAHIVP []  Legrand Como, Pharm.D., BCPS, AAHIVP []  Milus Glazier, Pharm.D. []  Stephens November, Pharm.D.  Positive urine culture Treated with cephalexin, organism sensitive to the same and no further patient follow-up is required at this time.  [name] 08/01/2016, 2:38 PM

## 2016-09-13 ENCOUNTER — Emergency Department (HOSPITAL_BASED_OUTPATIENT_CLINIC_OR_DEPARTMENT_OTHER)
Admission: EM | Admit: 2016-09-13 | Discharge: 2016-09-13 | Disposition: A | Discharge status: Home / Self Care | Payer: 59 | Attending: Emergency Medicine | Admitting: Emergency Medicine

## 2016-09-13 ENCOUNTER — Encounter (HOSPITAL_BASED_OUTPATIENT_CLINIC_OR_DEPARTMENT_OTHER): Payer: Self-pay | Admitting: *Deleted

## 2016-09-13 ENCOUNTER — Emergency Department (HOSPITAL_BASED_OUTPATIENT_CLINIC_OR_DEPARTMENT_OTHER): Payer: 59

## 2016-09-13 DIAGNOSIS — F17210 Nicotine dependence, cigarettes, uncomplicated: Secondary | ICD-10-CM | POA: Diagnosis not present

## 2016-09-13 DIAGNOSIS — R091 Pleurisy: Secondary | ICD-10-CM | POA: Insufficient documentation

## 2016-09-13 DIAGNOSIS — R079 Chest pain, unspecified: Secondary | ICD-10-CM | POA: Diagnosis present

## 2016-09-13 LAB — CBC WITH DIFFERENTIAL/PLATELET
Basophils Absolute: 0 10*3/uL (ref 0.0–0.1)
Basophils Relative: 1 %
Eosinophils Absolute: 0.1 10*3/uL (ref 0.0–0.7)
Eosinophils Relative: 1 %
HEMATOCRIT: 41.6 % (ref 36.0–46.0)
Hemoglobin: 14.3 g/dL (ref 12.0–15.0)
LYMPHS ABS: 2.5 10*3/uL (ref 0.7–4.0)
LYMPHS PCT: 41 %
MCH: 35 pg — AB (ref 26.0–34.0)
MCHC: 34.4 g/dL (ref 30.0–36.0)
MCV: 101.7 fL — ABNORMAL HIGH (ref 78.0–100.0)
MONO ABS: 0.8 10*3/uL (ref 0.1–1.0)
Monocytes Relative: 12 %
NEUTROS ABS: 2.7 10*3/uL (ref 1.7–7.7)
Neutrophils Relative %: 45 %
Platelets: 303 10*3/uL (ref 150–400)
RBC: 4.09 MIL/uL (ref 3.87–5.11)
RDW: 13 % (ref 11.5–15.5)
WBC: 6.1 10*3/uL (ref 4.0–10.5)

## 2016-09-13 LAB — BASIC METABOLIC PANEL
ANION GAP: 8 (ref 5–15)
BUN: 10 mg/dL (ref 6–20)
CO2: 27 mmol/L (ref 22–32)
Calcium: 10 mg/dL (ref 8.9–10.3)
Chloride: 103 mmol/L (ref 101–111)
Creatinine, Ser: 0.87 mg/dL (ref 0.44–1.00)
GFR calc Af Amer: >60 mL/min (ref >60)
GFR calc non Af Amer: >60 mL/min (ref >60)
GLUCOSE: 98 mg/dL (ref 65–99)
POTASSIUM: 4.5 mmol/L (ref 3.5–5.1)
Sodium: 138 mmol/L (ref 135–145)

## 2016-09-13 LAB — D-DIMER, QUANTITATIVE: D-Dimer, Quant: <0.27 ug/mL-FEU (ref 0.00–0.50)

## 2016-09-13 MED ORDER — NAPROXEN 500 MG PO TABS: 500.0000 mg | ORAL_TABLET | Freq: Two times a day (BID) | ORAL | 0 refills | Status: DC

## 2016-09-13 NOTE — ED Triage Notes (Signed)
2 days of sharp pain in her left chest. Worse with breathing. At work she felt like she was having an anxiety attack.

## 2016-09-13 NOTE — ED Notes (Signed)
Pt on monitor 

## 2016-09-13 NOTE — Discharge Instructions (Signed)
Naproxen as needed for pain.

## 2016-09-13 NOTE — ED Provider Notes (Signed)
[place] DEPT [facility] Provider Note   CSN: [id number] Arrival date & time: 09/13/16  1227     History   Chief Complaint Chief Complaint  Patient presents with  . Chest Pain    HPI Heather Sharp is a 26 y.o. female. Chief complaint is left chest pain.  HPI:  26 year old female. Has had some burning sharp pain in her left chest behind her left breast since this morning. No shortness of breath. No cough. No recent URI. Is not currently taking any hormones or birth control. Last time she took was over one year ago. She does smoke less than 1 pack per day. No leg swelling. No history of DVT or PE. No family history of blood clots. No recent prolonged immobilization cast splints fractures surgeries malignancies. Denies pregnancy. Last menstrual period one week ago.  History reviewed. No pertinent past medical history.  There are no active problems to display for this patient.   Past Surgical History:  Procedure Laterality Date  . CESAREAN SECTION    . INDUCED ABORTION      OB History    Gravida Para Term Preterm AB Living   2 1           SAB TAB Ectopic Multiple Live Births                   Home Medications    Prior to Admission medications   Medication Sig Start Date End Date Taking? Authorizing Provider  levonorgestrel-ethinyl estradiol (NORDETTE) 0.15-30 MG-MCG tablet Take 1 tablet by mouth daily.    Historical Provider, MD  metroNIDAZOLE (FLAGYL) 500 MG tablet Take 1 tablet (500 mg total) by mouth 2 (two) times daily. 11/25/15   Isla Pence, MD  naproxen (NAPROSYN) 500 MG tablet Take 1 tablet (500 mg total) by mouth 2 (two) times daily. 09/13/16   Tanna Furry, MD    Family History No family history on file.  Social History Social History  Substance Use Topics  . Smoking status: Current Every Day Smoker    Packs/day: 0.25    Types: Cigarettes  . Smokeless tobacco: Never Used  . Alcohol use Yes     Comment: occ     Allergies   Patient has no known  allergies.   Review of Systems Review of Systems  Constitutional: Negative for appetite change, chills, diaphoresis, fatigue and fever.  HENT: Negative for mouth sores, sore throat and trouble swallowing.   Eyes: Negative for visual disturbance.  Respiratory: Negative for cough, chest tightness, shortness of breath and wheezing.   Cardiovascular: Positive for chest pain.  Gastrointestinal: Negative for abdominal distention, abdominal pain, diarrhea, nausea and vomiting.  Endocrine: Negative for polydipsia, polyphagia and polyuria.  Genitourinary: Negative for dysuria, frequency and hematuria.  Musculoskeletal: Negative for gait problem.  Skin: Negative for color change, pallor and rash.  Neurological: Negative for dizziness, syncope, light-headedness and headaches.  Hematological: Does not bruise/bleed easily.  Psychiatric/Behavioral: Negative for behavioral problems and confusion.     Physical Exam Updated Vital Signs BP 137/72   Pulse 77   Temp 98.4 F (36.9 C) (Oral)   Resp 14   Ht 5\' 5"  (1.651 m)   Wt 130 lb (59 kg)   LMP 09/04/2016   SpO2 100%   BMI 21.63 kg/m   Physical Exam  Constitutional: She is oriented to person, place, and time. She appears well-developed and well-nourished. No distress.  HENT:  Head: Normocephalic.  Eyes: Conjunctivae are normal. Pupils are equal, round, and  reactive to light. No scleral icterus.  Neck: Normal range of motion. Neck supple. No thyromegaly present.  Cardiovascular: Normal rate and regular rhythm.  Exam reveals no gallop and no friction rub.   No murmur heard.   Pulmonary/Chest: Effort normal and breath sounds normal. No respiratory distress. She has no wheezes. She has no rales.  Abdominal: Soft. Bowel sounds are normal. She exhibits no distension. There is no tenderness. There is no rebound.  Musculoskeletal: Normal range of motion.  Neurological: She is alert and oriented to person, place, and time.  Skin: Skin is warm  and dry. No rash noted.  Psychiatric: She has a normal mood and affect. Her behavior is normal.     ED Treatments / Results  Labs (all labs ordered are listed, but only abnormal results are displayed) Labs Reviewed  CBC WITH DIFFERENTIAL/PLATELET - Abnormal; Notable for the following:       Result Value   MCV 101.7 (*)    MCH 35.0 (*)    All other components within normal limits  BASIC METABOLIC PANEL  D-DIMER, QUANTITATIVE (NOT AT [facility])    EKG  EKG Interpretation None       Radiology Dg Chest 2 View  Result Date: 09/13/2016 CLINICAL DATA:  Hervey Ard left chest pain which is worse with breathing for 2 days. EXAM: CHEST  2 VIEW COMPARISON:  PA and lateral chest 07/26/2014. Plain films of the chest and left ribs 01/09/2015. FINDINGS: The lungs are clear. Heart size is normal. No pneumothorax or pleural effusion. No bony abnormality. IMPRESSION: Normal chest. Electronically Signed   By: Inge Rise M.D.   On: 09/13/2016 14:20    Procedures Procedures (including critical care time)  Medications Ordered in ED Medications - No data to display   Initial Impression / Assessment and Plan / ED Course  I have reviewed the triage vital signs and the nursing notes.  Pertinent labs & imaging results that were available during my care of the patient were reviewed by me and considered in my medical decision making (see chart for details).     EKG shows no tachycardia. No acute or ischemic changes. No injury or ectopy. Chest x-ray normal. No leukocytosis. No fever. Not hypoxemic. Normal d-dimer. Patient reassured. Plan naproxen. Home treatment.  Final Clinical Impressions(s) / ED Diagnoses   Final diagnoses:  Pleurisy    New Prescriptions New Prescriptions   NAPROXEN (NAPROSYN) 500 MG TABLET    Take 1 tablet (500 mg total) by mouth 2 (two) times daily.     Tanna Furry, MD 09/13/16 1438

## 2017-01-23 ENCOUNTER — Emergency Department (HOSPITAL_BASED_OUTPATIENT_CLINIC_OR_DEPARTMENT_OTHER)
Admission: EM | Admit: 2017-01-23 | Discharge: 2017-01-23 | Disposition: A | Discharge status: Home / Self Care | Payer: Commercial Managed Care - PPO | Attending: Emergency Medicine | Admitting: Emergency Medicine

## 2017-01-23 ENCOUNTER — Encounter (HOSPITAL_BASED_OUTPATIENT_CLINIC_OR_DEPARTMENT_OTHER): Payer: Self-pay

## 2017-01-23 DIAGNOSIS — S71112A Laceration without foreign body, left thigh, initial encounter: Secondary | ICD-10-CM | POA: Diagnosis not present

## 2017-01-23 DIAGNOSIS — F17210 Nicotine dependence, cigarettes, uncomplicated: Secondary | ICD-10-CM | POA: Diagnosis not present

## 2017-01-23 DIAGNOSIS — Z4802 Encounter for removal of sutures: Secondary | ICD-10-CM | POA: Insufficient documentation

## 2017-01-23 DIAGNOSIS — Y939 Activity, unspecified: Secondary | ICD-10-CM | POA: Insufficient documentation

## 2017-01-23 DIAGNOSIS — Y999 Unspecified external cause status: Secondary | ICD-10-CM | POA: Insufficient documentation

## 2017-01-23 DIAGNOSIS — S71111A Laceration without foreign body, right thigh, initial encounter: Secondary | ICD-10-CM | POA: Diagnosis not present

## 2017-01-23 DIAGNOSIS — Y929 Unspecified place or not applicable: Secondary | ICD-10-CM | POA: Diagnosis not present
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
## 2017-01-23 NOTE — Discharge Instructions (Signed)
You were seen in the ED today with staple removal. The wound is continuing to heal. Do not scrub the area or apply do any heavy lifting. Follow up with your PCP in the coming week.

## 2017-07-18 ENCOUNTER — Encounter (HOSPITAL_BASED_OUTPATIENT_CLINIC_OR_DEPARTMENT_OTHER): Payer: Self-pay | Admitting: Emergency Medicine

## 2017-07-18 ENCOUNTER — Emergency Department (HOSPITAL_BASED_OUTPATIENT_CLINIC_OR_DEPARTMENT_OTHER)
Admission: EM | Admit: 2017-07-18 | Discharge: 2017-07-18 | Disposition: A | Discharge status: Home / Self Care | Payer: Managed Care, Other (non HMO) | Attending: Emergency Medicine | Admitting: Emergency Medicine

## 2017-07-18 ENCOUNTER — Other Ambulatory Visit: Payer: Self-pay

## 2017-07-18 DIAGNOSIS — B9789 Other viral agents as the cause of diseases classified elsewhere: Secondary | ICD-10-CM | POA: Insufficient documentation

## 2017-07-18 DIAGNOSIS — N760 Acute vaginitis: Secondary | ICD-10-CM | POA: Insufficient documentation

## 2017-07-18 DIAGNOSIS — B9689 Other specified bacterial agents as the cause of diseases classified elsewhere: Secondary | ICD-10-CM

## 2017-07-18 DIAGNOSIS — Z202 Contact with and (suspected) exposure to infections with a predominantly sexual mode of transmission: Secondary | ICD-10-CM | POA: Diagnosis present

## 2017-07-18 DIAGNOSIS — F17210 Nicotine dependence, cigarettes, uncomplicated: Secondary | ICD-10-CM | POA: Insufficient documentation

## 2017-07-18 LAB — WET PREP, GENITAL
SPERM: NONE SEEN
Trich, Wet Prep: NONE SEEN
Yeast Wet Prep HPF POC: NONE SEEN

## 2017-07-18 LAB — URINALYSIS, ROUTINE W REFLEX MICROSCOPIC
Bilirubin Urine: NEGATIVE
GLUCOSE, UA: NEGATIVE mg/dL
HGB URINE DIPSTICK: NEGATIVE
Ketones, ur: NEGATIVE mg/dL
Leukocytes, UA: NEGATIVE
Nitrite: NEGATIVE
PH: 7 (ref 5.0–8.0)
Protein, ur: NEGATIVE mg/dL
SPECIFIC GRAVITY, URINE: 1.015 (ref 1.005–1.030)

## 2017-07-18 LAB — PREGNANCY, URINE: Preg Test, Ur: NEGATIVE

## 2017-07-18 MED ORDER — AZITHROMYCIN 250 MG PO TABS
1000.0000 mg | ORAL_TABLET | Freq: Once | ORAL | Status: AC
  Administered 2017-07-18: 1000 mg via ORAL
  Filled 2017-07-18: qty 4

## 2017-07-18 MED ORDER — METRONIDAZOLE 500 MG PO TABS: 500.0000 mg | ORAL_TABLET | Freq: Two times a day (BID) | ORAL | 0 refills | Status: DC

## 2017-07-18 MED ORDER — CEFTRIAXONE SODIUM 250 MG IJ SOLR
250.0000 mg | Freq: Once | INTRAMUSCULAR | Status: AC
  Administered 2017-07-18: 250 mg via INTRAMUSCULAR
  Filled 2017-07-18: qty 250

## 2017-07-18 NOTE — Discharge Instructions (Signed)
Please read the instructions below. Talk with your primary care provider about any new medications. Please schedule an appointment for follow up with your OBGYN or primary care. Finish your antibiotic (Flagyl/Metronidazole) as prescribed. Do not drink alcohol with this medication as it will cause vomiting. You will receive a call from the hospital if your test results come back positive. Avoid sexual activity until you know your test results. If your results come back positive, it is important that you inform all of your sexual partners. Return to the ER for new or worsening symptoms.

## 2017-07-18 NOTE — ED Triage Notes (Signed)
Patient states that she was told that one of her partners had chlamydia, Patient denies any S/S

## 2017-07-18 NOTE — ED Provider Notes (Signed)
[facility] [facility] EMERGENCY DEPARTMENT Provider Note   CSN: [id number] Arrival date & time: 07/18/17  1357     History   Chief Complaint Chief Complaint  Patient presents with  . Exposure to STD    HPI Heather Sharp is a 26 y.o. female presenting to the ED with exposure to chlamydia.  Patient states her partner informed her that he tested positive for chlamydia this week.  She states she is only sexually active with this female partner, without protection.  Denies history of STDs.  Denies vaginal bleeding or discharge, pelvic pain, abdominal pain, urinary symptoms, or other complaints.  No recent antibiotics.  The history is provided by the patient.    History reviewed. No pertinent past medical history.  There are no active problems to display for this patient.   Past Surgical History:  Procedure Laterality Date  . CESAREAN SECTION    . INDUCED ABORTION      OB History    Gravida Para Term Preterm AB Living   2 1           SAB TAB Ectopic Multiple Live Births                   Home Medications    Prior to Admission medications   Medication Sig Start Date End Date Taking? Authorizing Provider  HYDROCODONE-ACETAMINOPHEN PO Take by mouth.    [provider]  IBUPROFEN PO Take by mouth.    [provider]  metroNIDAZOLE (FLAGYL) 500 MG tablet Take 1 tablet (500 mg total) by mouth 2 (two) times daily. 07/18/17   Robinson, [place] N, PA-C  UNKNOWN TO PATIENT ABX    [provider]    Family History History reviewed. No pertinent family history.  Social History Social History   Tobacco Use  . Smoking status: Current Every Day Smoker    Packs/day: 0.25    Types: Cigarettes  . Smokeless tobacco: Never Used  Substance Use Topics  . Alcohol use: Yes    Comment: occ  . Drug use: No     Allergies   Patient has no known allergies.   Review of Systems Review of Systems  Gastrointestinal: Negative for abdominal pain, nausea and  vomiting.  Genitourinary: Negative for dysuria, frequency, vaginal bleeding and vaginal discharge.  All other systems reviewed and are negative.    Physical Exam Updated Vital Signs BP 123/72 (BP Location: Right Arm)   Pulse 73   Temp 98.8 F (37.1 C) (Oral)   Resp 16   Ht 5\' 5"  (1.651 m)   Wt 61.2 kg (135 lb)   LMP 06/23/2017   SpO2 100%   BMI 22.47 kg/m   Physical Exam  Constitutional: She appears well-developed and well-nourished. No distress.  HENT:  Head: Normocephalic and atraumatic.  Eyes: Conjunctivae are normal.  Cardiovascular: Normal rate and intact distal pulses.  Pulmonary/Chest: Effort normal.  Abdominal: Soft. Bowel sounds are normal. She exhibits no distension. There is no tenderness. There is no rebound and no guarding.  Genitourinary: Uterus normal. There is no rash or tenderness on the right labia. There is no rash or tenderness on the left labia. Cervix exhibits no motion tenderness. Right adnexum displays no mass and no tenderness. Left adnexum displays no mass and no tenderness. There is erythema in the vagina. No tenderness in the vagina. Vaginal discharge found.  Genitourinary Comments: Exam performed with female chaperone present. Moderate amount of white vaginal discharge with vaginal erythema.   Neurological:  She is alert.  Skin: Skin is warm.  Psychiatric: She has a normal mood and affect. Her behavior is normal.  Nursing note and vitals reviewed.    ED Treatments / Results  Labs (all labs ordered are listed, but only abnormal results are displayed) Labs Reviewed  WET PREP, GENITAL - Abnormal; Notable for the following components:      Result Value   Clue Cells Wet Prep HPF POC PRESENT (*)    WBC, Wet Prep HPF POC MANY (*)    All other components within normal limits  URINALYSIS, ROUTINE W REFLEX MICROSCOPIC - Abnormal; Notable for the following components:   APPearance HAZY (*)    All other components within normal limits  PREGNANCY, URINE   HIV ANTIBODY (ROUTINE TESTING)  RPR  GC/CHLAMYDIA PROBE AMP ([place]) NOT AT [facility]    EKG  EKG Interpretation None       Radiology No results found.  Procedures Procedures (including critical care time)  Medications Ordered in ED Medications  cefTRIAXone (ROCEPHIN) injection 250 mg (250 mg Intramuscular Given 07/18/17 1711)  azithromycin (ZITHROMAX) tablet 1,000 mg (1,000 mg Oral Given 07/18/17 1711)     Initial Impression / Assessment and Plan / ED Course  I have reviewed the triage vital signs and the nursing notes.  Pertinent labs & imaging results that were available during my care of the patient were reviewed by me and considered in my medical decision making (see chart for details).     Pt w STD exposure. Patient to be discharged with instructions to follow up with OBGYN. Discussed importance of using protection when sexually active. Pt understands that they have GC/Chlamydia cultures pending, as well as HIV and RPR, and that they will need to inform all sexual partners if results return positive. Pt not concerning for PID because hemodynamically stable and no cervical motion tenderness on pelvic exam. Due to pt's history, pelvic exam, and wet prep with increased WBCs, pt has been treated prophylactically with Azithromycin and Rocephin.  Pt has also been treated with flagyl for Bacterial Vaginosis. Pt has been advised to not drink alcohol while on this medication. Pt afebrile and nontoxic, safe for discharge home.  Discussed results, findings, treatment and follow up. Patient advised of return precautions. Patient verbalized understanding and agreed with plan.  Final Clinical Impressions(s) / ED Diagnoses   Final diagnoses:  Exposure to STD  BV (bacterial vaginosis)    ED Discharge Orders        Ordered    metroNIDAZOLE (FLAGYL) 500 MG tablet  2 times daily     07/18/17 1753       Robinson, [place] N, PA-C 07/18/17 1754    Little, Wenda Overland,  MD 07/19/17 [phone]

## 2017-07-19 LAB — GC/CHLAMYDIA PROBE AMP (~~LOC~~) NOT AT ARMC
Chlamydia: NEGATIVE
Neisseria Gonorrhea: NEGATIVE

## 2017-07-19 LAB — RPR: RPR Ser Ql: NONREACTIVE

## 2017-07-19 LAB — HIV ANTIBODY (ROUTINE TESTING W REFLEX): HIV Screen 4th Generation wRfx: NONREACTIVE

## 2020-01-19 ENCOUNTER — Encounter (HOSPITAL_BASED_OUTPATIENT_CLINIC_OR_DEPARTMENT_OTHER): Payer: Self-pay | Admitting: Emergency Medicine

## 2020-01-19 ENCOUNTER — Emergency Department (HOSPITAL_BASED_OUTPATIENT_CLINIC_OR_DEPARTMENT_OTHER)
Admission: EM | Admit: 2020-01-19 | Discharge: 2020-01-20 | Disposition: A | Discharge status: Home / Self Care | Payer: Managed Care, Other (non HMO) | Attending: Emergency Medicine | Admitting: Emergency Medicine

## 2020-01-19 ENCOUNTER — Other Ambulatory Visit: Payer: Self-pay

## 2020-01-19 DIAGNOSIS — R112 Nausea with vomiting, unspecified: Secondary | ICD-10-CM | POA: Insufficient documentation

## 2020-01-19 DIAGNOSIS — Z5321 Procedure and treatment not carried out due to patient leaving prior to being seen by health care provider: Secondary | ICD-10-CM | POA: Insufficient documentation

## 2020-01-19 NOTE — ED Triage Notes (Signed)
Reports feeling like something is stuck in the back of the throat causing her to be nauseated.  Reports its been going on for the last 4 days.  Says it feels like mucous sitting there that won't come up.  Reports no issues with eating or drinking.

## 2020-11-27 ENCOUNTER — Emergency Department (HOSPITAL_BASED_OUTPATIENT_CLINIC_OR_DEPARTMENT_OTHER)
Admission: EM | Admit: 2020-11-27 | Discharge: 2020-11-27 | Disposition: A | Discharge status: Home / Self Care | Payer: 59 | Attending: Emergency Medicine | Admitting: Emergency Medicine

## 2020-11-27 ENCOUNTER — Other Ambulatory Visit: Payer: Self-pay

## 2020-11-27 DIAGNOSIS — F17210 Nicotine dependence, cigarettes, uncomplicated: Secondary | ICD-10-CM | POA: Insufficient documentation

## 2020-11-27 DIAGNOSIS — L279 Dermatitis due to unspecified substance taken internally: Secondary | ICD-10-CM | POA: Insufficient documentation

## 2020-11-27 DIAGNOSIS — R21 Rash and other nonspecific skin eruption: Secondary | ICD-10-CM

## 2020-11-27 MED ORDER — CEPHALEXIN 500 MG PO CAPS: 500.0000 mg | ORAL_CAPSULE | Freq: Two times a day (BID) | ORAL | 0 refills | Status: AC

## 2020-11-27 NOTE — ED Triage Notes (Signed)
Pt. Reports a bug bite that happened last night while she was sitting on her porch.  Pt. Said she wasn't sure if it was a spider or a snake.  She only noticed the area when it started to  swell last night.

## 2020-11-27 NOTE — ED Triage Notes (Signed)
No respiratory distress noted yesterday or today.  Pt. Has clear lungs noted upon arrival and is not having any difficulty breathing.

## 2020-11-27 NOTE — ED Provider Notes (Signed)
[facility] EMERGENCY DEPARTMENT Provider Note   CSN: [id number] Arrival date & time: 11/27/20  1437     History Chief Complaint  Patient presents with   Insect Bite    Heather Sharp is a 30 y.o. female.  30yo F who p/w rash. Last night she was sitting outside on her porch and later went inside and noticed an area of redness and itching on R lateral thigh.  Today, the swelling has been worse despite trying cortisone.  She notes mild pain.  No other areas of rash.  No fevers or other complaints.  Denies history of abscesses.  The history is provided by the patient.      No past medical history on file.  There are no problems to display for this patient.   Past Surgical History:  Procedure Laterality Date   CESAREAN SECTION     INDUCED ABORTION       OB History     Gravida  2   Para  1   Term      Preterm      AB      Living         SAB      IAB      Ectopic      Multiple      Live Births              No family history on file.  Social History   Tobacco Use   Smoking status: Every Day    Packs/day: 0.25    Pack years: 0.00    Types: Cigarettes   Smokeless tobacco: Never  Substance Use Topics   Alcohol use: Yes    Comment: occ   Drug use: No    Home Medications Prior to Admission medications   Medication Sig Start Date End Date Taking? Authorizing Provider  cephALEXin (KEFLEX) 500 MG capsule Take 1 capsule (500 mg total) by mouth 2 (two) times daily for 7 days. 11/27/20 12/04/20 Yes Aydn Ferrara, Wenda Overland, MD  HYDROCODONE-ACETAMINOPHEN PO Take by mouth.    [provider]  IBUPROFEN PO Take by mouth.    [provider]  metroNIDAZOLE (FLAGYL) 500 MG tablet Take 1 tablet (500 mg total) by mouth 2 (two) times daily. 07/18/17   Robinson, [place] N, PA-C  UNKNOWN TO PATIENT ABX    [provider]    Allergies    Patient has no known allergies.  Review of Systems   Review of Systems All other systems  reviewed and are negative except that which was mentioned in HPI  Physical Exam Updated Vital Signs BP 127/81 (BP Location: Right Arm)   Pulse 82   Temp 98.9 F (37.2 C) (Oral)   Resp 19   Ht 5\' 5"  (1.651 m)   Wt 66.7 kg   SpO2 99%   BMI 24.46 kg/m   Physical Exam Vitals and nursing note reviewed.  Constitutional:      General: She is not in acute distress.    Appearance: She is well-developed.  HENT:     Head: Normocephalic and atraumatic.  Eyes:     Conjunctiva/sclera: Conjunctivae normal.  Musculoskeletal:     Cervical back: Neck supple.  Skin:    General: Skin is warm and dry.     Findings: Erythema and rash present.     Comments: Large circular area of erythema and mild induration on proximal lateral to posterior R thigh, no central fluctuance, no drainage  Neurological:  Mental Status: She is alert and oriented to person, place, and time.  Psychiatric:        Judgment: Judgment normal.    ED Results / Procedures / Treatments   Labs (all labs ordered are listed, but only abnormal results are displayed) Labs Reviewed - No data to display  EKG None  Radiology No results found.  Procedures Procedures   Medications Ordered in ED Medications - No data to display  ED Course  I have reviewed the triage vital signs and the nursing notes.    MDM Rules/Calculators/A&P                          DDX includes insect bite with local histamine reaction versus area of cellulitis.  Recommended applying ice and hydrocortisone and provided with prescription for Keflex to initiate if area does not improve in the next 12 hours with the supportive measures.  Based on timeline and appearance, I feel that histamine reaction is more likely than cellulitis.  Reviewed return precautions and she voiced understanding. Final Clinical Impression(s) / ED Diagnoses Final diagnoses:  Localized skin eruption    Rx / DC Orders ED Discharge Orders          Ordered     cephALEXin (KEFLEX) 500 MG capsule  2 times daily        11/27/20 1759             Hanaa Payes, Wenda Overland, MD 11/27/20 1810

## 2020-11-27 NOTE — ED Triage Notes (Signed)
Noted area on R buttock is edematous and looks like a large hive or welt red and raised.

## 2021-05-15 ENCOUNTER — Emergency Department (HOSPITAL_BASED_OUTPATIENT_CLINIC_OR_DEPARTMENT_OTHER): Payer: Self-pay

## 2021-05-15 ENCOUNTER — Emergency Department (HOSPITAL_BASED_OUTPATIENT_CLINIC_OR_DEPARTMENT_OTHER)
Admission: EM | Admit: 2021-05-15 | Discharge: 2021-05-15 | Disposition: A | Discharge status: Home / Self Care | Payer: Self-pay | Attending: Emergency Medicine | Admitting: Emergency Medicine

## 2021-05-15 ENCOUNTER — Encounter (HOSPITAL_BASED_OUTPATIENT_CLINIC_OR_DEPARTMENT_OTHER): Payer: Self-pay | Admitting: *Deleted

## 2021-05-15 ENCOUNTER — Other Ambulatory Visit: Payer: Self-pay

## 2021-05-15 DIAGNOSIS — M79606 Pain in leg, unspecified: Secondary | ICD-10-CM

## 2021-05-15 DIAGNOSIS — M79605 Pain in left leg: Secondary | ICD-10-CM

## 2021-05-15 DIAGNOSIS — M79604 Pain in right leg: Secondary | ICD-10-CM | POA: Insufficient documentation

## 2021-05-15 DIAGNOSIS — F17210 Nicotine dependence, cigarettes, uncomplicated: Secondary | ICD-10-CM | POA: Insufficient documentation

## 2021-05-15 DIAGNOSIS — R202 Paresthesia of skin: Secondary | ICD-10-CM | POA: Insufficient documentation

## 2021-05-15 LAB — PREGNANCY, URINE: Preg Test, Ur: NEGATIVE

## 2021-05-15 NOTE — Discharge Instructions (Addendum)
Your imaging does not show a reason for your pain today.  It did show a small metallic area on your x-ray that we discussed.  It is nothing that needs follow-up and I do not believe it is causing her symptoms, however keep in mind that it is there.  I have attached a sports medicine doctor for you to follow-up with for a potential knee ligament or meniscus injury.  Your primary care provider is also a good resource for your symptoms if you are unable to get in with sports medicine soon.

## 2021-05-15 NOTE — ED Provider Notes (Signed)
[place] EMERGENCY DEPARTMENT Provider Note   CSN: [id number] Arrival date & time: 05/15/21  1613     History Chief Complaint  Patient presents with   Leg Pain    Heather Sharp is a 30 y.o. female with a past medical history of anxiety presenting today with complaint of left leg pain.  Reports that over the past 2 weeks she has been experiencing soreness that feels like it is "on the inside."  No history of DVT, denies leg swelling, no recent travel or surgery.  Does not take OCPs.  Says that occasionally she feels like her left foot goes completely numb due to this discomfort.  Also states that sometimes she feels pain in her right leg as well.   History reviewed. No pertinent past medical history.  There are no problems to display for this patient.   Past Surgical History:  Procedure Laterality Date   CESAREAN SECTION     INDUCED ABORTION       OB History     Gravida  2   Para  1   Term      Preterm      AB      Living         SAB      IAB      Ectopic      Multiple      Live Births              No family history on file.  Social History   Tobacco Use   Smoking status: Every Day    Packs/day: 0.25    Types: Cigarettes   Smokeless tobacco: Never  Vaping Use   Vaping Use: Never used  Substance Use Topics   Alcohol use: Yes    Comment: occ   Drug use: No    Home Medications Prior to Admission medications   Medication Sig Start Date End Date Taking? Authorizing Provider  HYDROCODONE-ACETAMINOPHEN PO Take by mouth.    [provider]  IBUPROFEN PO Take by mouth.    [provider]  metroNIDAZOLE (FLAGYL) 500 MG tablet Take 1 tablet (500 mg total) by mouth 2 (two) times daily. 07/18/17   Robinson, [place] N, PA-C  UNKNOWN TO PATIENT ABX    [provider]    Allergies    Patient has no known allergies.  Review of Systems   Review of Systems  Respiratory:  Negative for shortness of breath.    Cardiovascular:  Negative for chest pain.  Musculoskeletal:  Positive for myalgias.  Skin:  Negative for wound.  Neurological:  Positive for numbness. Negative for weakness.  All other systems reviewed and are negative.  Physical Exam Updated Vital Signs Ht 5\' 5"  (1.651 m)    Wt 63.5 kg    LMP 05/03/2021    Breastfeeding No    BMI 23.30 kg/m   Physical Exam Vitals and nursing note reviewed.  Constitutional:      Appearance: Normal appearance.  HENT:     Head: Normocephalic and atraumatic.  Eyes:     General: No scleral icterus.    Conjunctiva/sclera: Conjunctivae normal.  Pulmonary:     Effort: Pulmonary effort is normal. No respiratory distress.  Musculoskeletal:        General: Tenderness (Along quadriceps.) present. No swelling. Normal range of motion.     Comments: Full range of motion of the knee.  Pain with flexion and varus and valgus testing.  Strong DP pulses bilaterally  Skin:    General: Skin is warm and dry.     Findings: No rash.  Neurological:     Mental Status: She is alert.  Psychiatric:        Mood and Affect: Mood normal.    ED Results / Procedures / Treatments   Labs (all labs ordered are listed, but only abnormal results are displayed) Labs Reviewed  PREGNANCY, URINE    EKG None  Radiology DG Lumbar Spine 2-3 Views  Result Date: 05/15/2021 CLINICAL DATA:  Leg pain. Additional history provided by scanning technologist: Patient reports aching left leg pain for 1 week, intermittent right foot numbness. EXAM: LUMBAR SPINE - 2-3 VIEW COMPARISON:  Abdominal radiographs 10/15/2011. FINDINGS: Five lumbar vertebrae. The caudal most well-formed intervertebral disc space is designated L5-S1. Trace L1-L2 and L2-L3 grade 1 retrolisthesis. Vertebral body height is maintained. No radiographic evidence of acute fracture to the lumbar spine. The intervertebral disc spaces are maintained. No appreciable significant facet arthrosis. A 12 mm linear metallic foreign  body projects over the left hemiabdomen on the AP radiograph. IMPRESSION: No radiographic evidence of fracture to the lumbar spine. No appreciable significant lumbar spondylosis. Trace L1-L2 and L2-L3 grade 1 retrolisthesis. A 12 mm linear metallic foreign body projects over the left hemiabdomen on the AP radiograph. This may overlie the patient or may reflect a surgical clip. An ingested foreign body cannot be definitively excluded and clinical correlation is recommended. Dedicated AP and lateral abdominal radiographs may be obtained for further evaluation, as clinically warranted. Electronically Signed   By: Kellie Simmering D.O.   On: 05/15/2021 18:49   US Venous Img Lower  Left (DVT Study)  Result Date: 05/15/2021 CLINICAL DATA:  leg pain EXAM: LEFT LOWER EXTREMITY VENOUS DOPPLER ULTRASOUND TECHNIQUE: Gray-scale sonography with compression, as well as color and duplex ultrasound, were performed to evaluate the deep venous system(s) from the level of the common femoral vein through the popliteal and proximal calf veins. COMPARISON:  None. FINDINGS: VENOUS Normal compressibility of the common femoral, superficial femoral, and popliteal veins, as well as the visualized calf veins. Visualized portions of profunda femoral vein and great saphenous vein unremarkable. No filling defects to suggest DVT on grayscale or color Doppler imaging. Doppler waveforms show normal direction of venous flow, normal respiratory plasticity and response to augmentation. Limited views of the contralateral common femoral vein are unremarkable. OTHER None. Limitations: none IMPRESSION: Negative. Electronically Signed   By: Valentino Saxon M.D.   On: 05/15/2021 17:42    Procedures Procedures   Medications Ordered in ED Medications - No data to display  ED Course  I have reviewed the triage vital signs and the nursing notes.  Pertinent labs & imaging results that were available during my care of the patient were reviewed by me  and considered in my medical decision making (see chart for details).    MDM Rules/Calculators/A&P Healthy 30 year old female presenting with leg pain.  Worse with movement and walking.  Denies any back pain however reports that sometimes the discomfort moves to her right lower extremity.  Low suspicion for DVT however due to the ongoing pain that she describes as throbbing DVT study was ordered.  DVT study negative.  Lumbar spine radiographs obtained to rule out any obvious bony abnormalities responsible for a nerve impingement.  This was also negative for an explanation of her pain however did note a radiopaque area in the left hemidiaphragm.  Patient denied any clips or surgical procedures.  She does not  have any piercings in the area.  I do not believe she needs urgent radiographs to further evaluate the area.  She is agreeable to follow-up.  Patient to be referred to sports medicine for additional work-up.  Likely musculoskeletal in etiology, positive varus and valgus testing as well as positive McMurray.  She is agreeable to this plan.  Final Clinical Impression(s) / ED Diagnoses Final diagnoses:  Left leg pain    Rx / DC Orders Results and diagnoses were explained to the patient. Return precautions discussed in full. Patient had no additional questions and expressed complete understanding.     Darliss Ridgel 05/15/21 Humphrey Rolls, MD 05/19/21 [phone]

## 2021-05-15 NOTE — ED Triage Notes (Signed)
Pt reports left leg "aching" x 1 week (points to back of knee). Also states her right foot "sometimes goes numb". Denies recent travel, injury. Ambulated to triage without need for assist

## 2021-08-22 ENCOUNTER — Other Ambulatory Visit: Payer: Self-pay

## 2021-08-22 ENCOUNTER — Emergency Department (HOSPITAL_BASED_OUTPATIENT_CLINIC_OR_DEPARTMENT_OTHER)
Admission: EM | Admit: 2021-08-22 | Discharge: 2021-08-22 | Disposition: A | Discharge status: Home / Self Care | Payer: Medicaid Other | Attending: Emergency Medicine | Admitting: Emergency Medicine

## 2021-08-22 ENCOUNTER — Encounter (HOSPITAL_BASED_OUTPATIENT_CLINIC_OR_DEPARTMENT_OTHER): Payer: Self-pay | Admitting: Emergency Medicine

## 2021-08-22 DIAGNOSIS — R040 Epistaxis: Secondary | ICD-10-CM | POA: Insufficient documentation

## 2021-08-22 MED ORDER — OXYMETAZOLINE HCL 0.05 % NA SOLN
1.0000 | Freq: Once | NASAL | Status: AC
  Administered 2021-08-22: 1 via NASAL
  Filled 2021-08-22: qty 30

## 2021-08-22 NOTE — ED Notes (Signed)
Pt ambulatory to waiting room. Pt verbalized understanding of discharge instructions.   

## 2021-08-22 NOTE — ED Provider Notes (Signed)
?[facility] EMERGENCY DEPARTMENT ?Provider Note ? ? ?CSN: [id number] ?Arrival date & time: 08/22/21  8882 ? ?  ? ?History ? ?Chief Complaint  ?Patient presents with  ? Epistaxis  ? ? ?Heather Sharp is a 31 y.o. female. ? ?31 year old female presents with complaint of nose bleed from right nare last night that stopped with pressure, bleeding returned this morning. Arrives without active bleeding. Not anticoagulated. No history of trauma.  ?States that she has had a cold recently and has been blowing her nose frequently.  Also took Tylenol cold and sinus. ? ? ?  ? ?Home Medications ?Prior to Admission medications   ?Medication Sig Start Date End Date Taking? Authorizing Provider  ?HYDROCODONE-ACETAMINOPHEN PO Take by mouth.    [provider]  ?IBUPROFEN PO Take by mouth.    [provider]  ?metroNIDAZOLE (FLAGYL) 500 MG tablet Take 1 tablet (500 mg total) by mouth 2 (two) times daily. 07/18/17   Robinson, [place] N, PA-C  ?UNKNOWN TO PATIENT ABX    [provider]  ?   ? ?Allergies    ?Patient has no known allergies.   ? ?Review of Systems   ?Review of Systems ?Negative except as per HPI ?Physical Exam ?Updated Vital Signs ?BP 129/86 (BP Location: Right Arm)   Pulse 78   Temp 98 ?F (36.7 ?C) (Oral)   Resp 16   SpO2 100%  ?Physical Exam ?Vitals and nursing note reviewed.  ?Constitutional:   ?   General: She is not in acute distress. ?   Appearance: She is well-developed. She is not diaphoretic.  ?HENT:  ?   Head: Normocephalic and atraumatic.  ?   Nose: Mucosal edema and congestion present. No nasal deformity, septal deviation, signs of injury, nasal tenderness or rhinorrhea.  ?   Right Nostril: No epistaxis, septal hematoma or occlusion.  ?   Left Nostril: No epistaxis, septal hematoma or occlusion.  ?   Mouth/Throat:  ?   Mouth: Mucous membranes are moist.  ?Eyes:  ?   Conjunctiva/sclera: Conjunctivae normal.  ?Pulmonary:  ?   Effort: Pulmonary effort is normal.   ?Musculoskeletal:  ?   Cervical back: Neck supple.  ?Lymphadenopathy:  ?   Cervical: No cervical adenopathy.  ?Skin: ?   General: Skin is warm and dry.  ?   Findings: No erythema or rash.  ?Neurological:  ?   Mental Status: She is alert and oriented to person, place, and time.  ?Psychiatric:     ?   Behavior: Behavior normal.  ? ? ?ED Results / Procedures / Treatments   ?Labs ?(all labs ordered are listed, but only abnormal results are displayed) ?Labs Reviewed - No data to display ? ?EKG ?None ? ?Radiology ?No results found. ? ?Procedures ?Procedures  ? ? ?Medications Ordered in ED ?Medications  ?oxymetazoline (AFRIN) 0.05 % nasal spray 1 spray (has no administration in time range)  ? ? ?ED Course/ Medical Decision Making/ A&P ?  ?                        ?Medical Decision Making ? ?31 year old female with complaint of right-sided nosebleed as above.  Arrives in the ER without further bleeding.  Exam shows boggy nasal membranes with congestion, no active bleeding, no obvious recent bleed.  Patient is instructed on epistasis management, given bottle of Afrin with instructions for use and return to ER precautions.  Follow-up with ENT if continues to have  intermittent nosebleeds although suspect that this is secondary to the cold medication and blowing her nose with her cold recently. ? ? ? ? ? ? ? ?Final Clinical Impression(s) / ED Diagnoses ?Final diagnoses:  ?Right-sided epistaxis  ? ? ?Rx / DC Orders ?ED Discharge Orders   ? ? None  ? ?  ? ? ?  ?Tacy Learn, PA-C ?08/22/21 1013 ? ?  ?Wyvonnia Dusky, MD ?08/22/21 1028 ? ?

## 2021-08-22 NOTE — Discharge Instructions (Signed)
If bleeding returns: ?Blow your nose ?Spray Afrin in both sides of your nose ?Apply pressure/pinch her nose shut and hold consistent pressure x15 minutes. ? ?If bleeding continues, return to the emergency room otherwise can follow-up with ENT if you continue to have intermittent nosebleeds. ?

## 2021-08-22 NOTE — ED Triage Notes (Signed)
Pt reports nose bleed from right nare last night that stopped with pressure. PT reports the bleeding began again this morning. No bleeding at this time.  ?

## 2021-11-29 ENCOUNTER — Emergency Department (HOSPITAL_BASED_OUTPATIENT_CLINIC_OR_DEPARTMENT_OTHER)
Admission: EM | Admit: 2021-11-29 | Discharge: 2021-11-29 | Disposition: A | Discharge status: Home / Self Care | Payer: Medicaid Other | Attending: Emergency Medicine | Admitting: Emergency Medicine

## 2021-11-29 ENCOUNTER — Encounter (HOSPITAL_BASED_OUTPATIENT_CLINIC_OR_DEPARTMENT_OTHER): Payer: Self-pay

## 2021-11-29 DIAGNOSIS — R590 Localized enlarged lymph nodes: Secondary | ICD-10-CM | POA: Insufficient documentation

## 2021-11-29 DIAGNOSIS — R220 Localized swelling, mass and lump, head: Secondary | ICD-10-CM | POA: Diagnosis present

## 2021-11-29 MED ORDER — ACETAMINOPHEN 500 MG PO TABS
1000.0000 mg | ORAL_TABLET | ORAL | Status: AC
  Administered 2021-11-29: 1000 mg via ORAL
  Filled 2021-11-29: qty 2

## 2021-11-29 NOTE — ED Triage Notes (Signed)
C/o pain under right ear. Denies jaw/neck/throat pain. States she feels a lump. Denies feeling sick.

## 2021-11-29 NOTE — Discharge Instructions (Addendum)
You were seen in the ED for evaluation of your right ear pain. This seems to be a lymph node or possible an early abscess. I would like for you to continue to place warm compresses to the area and take Tylenol or ibuprofen as needed for pain. This is likely here because of your recent illness. If this becomes worse, more painful, fever, unable to move your jaw, please return to the ED immediately.  Please follow up with your PCP for reassessment. If you have any concern, new or worsening symptoms, please return to the nearest ER for evaluation.   Contact a health care provider if you have: Lymph glands that: Are still swollen after 2 weeks. Have suddenly gotten bigger or the swelling spreads. Are red, painful, or hard. Fluid leaking from the skin near an enlarged lymph gland. Problems with breathing. A fever, chills, or night sweats. Fatigue. A sore throat. Pain in your abdomen. Weight loss. Get help right away if you have: Severe pain. Chest pain. Shortness of breath. These symptoms may represent a serious problem that is an emergency. Do not wait to see if the symptoms will go away. Get medical help right away. Call your local emergency services (911 in the [place] Do not drive yourself to the hospital.

## 2021-11-29 NOTE — ED Provider Notes (Addendum)
[facility] EMERGENCY DEPARTMENT Provider Note   CSN: [id number] Arrival date & time: 11/29/21  1750     History Chief Complaint  Patient presents with   Ear Pain    Heather Sharp is a 31 y.o. female otherwise healthy presents to the ED for evaluation of a bump by her right ear that showed up around 1500 today. The patient reports that is is painful to touch. Denies any trauma to the area. The patient reports that she recently had a viral illness a few days ago. Denies any fevers, night sweats, or weight loss. She denies any sore throat, ear pain, dental pain, or fever. No pain medication trialed prior to arrival.   HPI     Home Medications Prior to Admission medications   Medication Sig Start Date End Date Taking? Authorizing Provider  HYDROCODONE-ACETAMINOPHEN PO Take by mouth.    [provider]  IBUPROFEN PO Take by mouth.    [provider]  metroNIDAZOLE (FLAGYL) 500 MG tablet Take 1 tablet (500 mg total) by mouth 2 (two) times daily. 07/18/17   Robinson, [place] N, PA-C  UNKNOWN TO PATIENT ABX    [provider]      Allergies    Patient has no known allergies.    Review of Systems   Review of Systems  Constitutional:  Negative for chills, fever and unexpected weight change.  HENT:  Negative for congestion, dental problem and sore throat.        Reports bump by right ear  Respiratory:  Negative for shortness of breath.   Cardiovascular:  Negative for chest pain.  Gastrointestinal:  Negative for abdominal pain, nausea and vomiting.  Musculoskeletal:  Negative for back pain and neck pain.  Neurological:  Negative for headaches.    Physical Exam Updated Vital Signs BP 137/84 (BP Location: Right Arm)   Pulse 77   Temp 98.9 F (37.2 C) (Oral)   Resp 18   Ht '5\' 5"'$  (1.651 m)   Wt 63.5 kg   LMP 11/26/2021 (Exact Date)   SpO2 100%   BMI 23.30 kg/m  Physical Exam Vitals and nursing note reviewed.  Constitutional:       General: She is not in acute distress.    Appearance: Normal appearance. She is not ill-appearing or toxic-appearing.     Comments: Actively chewing gum  HENT:     Head: Normocephalic and atraumatic.     Right Ear: Tympanic membrane, ear canal and external ear normal.     Left Ear: Tympanic membrane, ear canal and external ear normal.     Ears:     Comments: Piercing in the tragus of the right ear. The patient has a small, pea-sized pre-auricular lymph node palpated on the right that is tender. No overt swelling, overlying skin changes, or warmth to the area.  No mastoid tenderness or swelling noted bilaterally.  No overlying erythema or warmth to the area.    Nose: Nose normal.     Mouth/Throat:     Mouth: Mucous membranes are moist.     Pharynx: No oropharyngeal exudate or posterior oropharyngeal erythema.     Comments: Multiple filed cavities. No overt caries noted. MMM. Uvula midline. Airway patent. No pharyngeal erythema, edema, or exudate noted.  Eyes:     General: No scleral icterus. Pulmonary:     Effort: Pulmonary effort is normal. No respiratory distress.  Musculoskeletal:     Cervical back: Normal range of motion and neck supple. No rigidity  or tenderness.  Lymphadenopathy:     Cervical: No cervical adenopathy.  Skin:    General: Skin is dry.     Findings: No rash.  Neurological:     General: No focal deficit present.     Mental Status: She is alert. Mental status is at baseline.  Psychiatric:        Mood and Affect: Mood normal.     ED Results / Procedures / Treatments   Labs (all labs ordered are listed, but only abnormal results are displayed) Labs Reviewed - No data to display  EKG None  Radiology No results found.  Procedures Procedures  Medications Ordered in ED Medications  acetaminophen (TYLENOL) tablet 1,000 mg (has no administration in time range)    ED Course/ Medical Decision Making/ A&P                           Medical Decision  Making Risk OTC drugs.   31 year old female presents the emergency room for evaluation of right sided bump by her ear.  Differential diagnosis includes but not limited to abscess, lymph node, TMJ, mastoiditis.  Vital signs are unremarkable. Patient has a very small preauricular lymph node noted on the right.  There is no overlying warmth or erythema.  Patient's ear exams are normal.  No other lymphadenopathy palpated in the occipital, postauricular, or cervical lymph node chain.  She has no mastoid tenderness or swelling noted.  Patient has multiple filled cavities.  No overt dental caries noted.  Patient likely has this because she was recently ill a few days ago.  Because the patient has had this swelling for less than 5 hours, will advised patient to put warm compresses to the area and take Tylenol or ibuprofen as needed for pain.  We discussed strict return precautions and red flag symptoms.  I suggested that she follow-up for with her PCP for lab work.  Patient verbalized understanding and agrees to plan.  Patient is stable and being discharged home in good condition.  I discussed this case with my attending physician who cosigned this note including patient's presenting symptoms, physical exam, and planned diagnostics and interventions. Attending physician stated agreement with plan or made changes to plan which were implemented.   Final Clinical Impression(s) / ED Diagnoses Final diagnoses:  Lymphadenopathy, periauricular    Rx / DC Orders ED Discharge Orders     None         Sherrell Puller, PA-C 11/29/21 2008    Callaway Hailes, PA-C 11/29/21 2009    Dorie Rank, MD 12/06/21 [phone]

## 2021-11-30 ENCOUNTER — Emergency Department (HOSPITAL_BASED_OUTPATIENT_CLINIC_OR_DEPARTMENT_OTHER): Payer: Medicaid Other

## 2021-11-30 ENCOUNTER — Emergency Department (HOSPITAL_BASED_OUTPATIENT_CLINIC_OR_DEPARTMENT_OTHER)
Admission: EM | Admit: 2021-11-30 | Discharge: 2021-11-30 | Disposition: A | Discharge status: Home / Self Care | Payer: Medicaid Other | Attending: Emergency Medicine | Admitting: Emergency Medicine

## 2021-11-30 ENCOUNTER — Other Ambulatory Visit: Payer: Self-pay

## 2021-11-30 ENCOUNTER — Encounter (HOSPITAL_BASED_OUTPATIENT_CLINIC_OR_DEPARTMENT_OTHER): Payer: Self-pay | Admitting: Emergency Medicine

## 2021-11-30 DIAGNOSIS — K1120 Sialoadenitis, unspecified: Secondary | ICD-10-CM | POA: Diagnosis not present

## 2021-11-30 DIAGNOSIS — D490 Neoplasm of unspecified behavior of digestive system: Secondary | ICD-10-CM

## 2021-11-30 DIAGNOSIS — R220 Localized swelling, mass and lump, head: Secondary | ICD-10-CM | POA: Diagnosis present

## 2021-11-30 DIAGNOSIS — C07 Malignant neoplasm of parotid gland: Secondary | ICD-10-CM | POA: Insufficient documentation

## 2021-11-30 LAB — COMPREHENSIVE METABOLIC PANEL
ALT: 12 U/L (ref 0–44)
AST: 20 U/L (ref 15–41)
Albumin: 4.2 g/dL (ref 3.5–5.0)
Alkaline Phosphatase: 63 U/L (ref 38–126)
Anion gap: 7 (ref 5–15)
BUN: 9 mg/dL (ref 6–20)
CO2: 26 mmol/L (ref 22–32)
Calcium: 9.2 mg/dL (ref 8.9–10.3)
Chloride: 104 mmol/L (ref 98–111)
Creatinine, Ser: 0.66 mg/dL (ref 0.44–1.00)
GFR, Estimated: >60 mL/min (ref >60)
Glucose, Bld: 96 mg/dL (ref 70–99)
Potassium: 4.1 mmol/L (ref 3.5–5.1)
Sodium: 137 mmol/L (ref 135–145)
Total Bilirubin: 0.8 mg/dL (ref 0.3–1.2)
Total Protein: 7.5 g/dL (ref 6.5–8.1)

## 2021-11-30 LAB — CBC WITH DIFFERENTIAL/PLATELET
Abs Immature Granulocytes: 0.03 K/uL (ref 0.00–0.07)
Basophils Absolute: 0 K/uL (ref 0.0–0.1)
Basophils Relative: 0 %
Eosinophils Absolute: 0.1 K/uL (ref 0.0–0.5)
Eosinophils Relative: 1 %
HCT: 42.4 % (ref 36.0–46.0)
Hemoglobin: 14.6 g/dL (ref 12.0–15.0)
Immature Granulocytes: 0 %
Lymphocytes Relative: 22 %
Lymphs Abs: 2 K/uL (ref 0.7–4.0)
MCH: 36 pg — ABNORMAL HIGH (ref 26.0–34.0)
MCHC: 34.4 g/dL (ref 30.0–36.0)
MCV: 104.7 fL — ABNORMAL HIGH (ref 80.0–100.0)
Monocytes Absolute: 1.4 K/uL — ABNORMAL HIGH (ref 0.1–1.0)
Monocytes Relative: 15 %
Neutro Abs: 5.6 K/uL (ref 1.7–7.7)
Neutrophils Relative %: 62 %
Platelets: 379 K/uL (ref 150–400)
RBC: 4.05 MIL/uL (ref 3.87–5.11)
RDW: 13.2 % (ref 11.5–15.5)
WBC: 9.1 K/uL (ref 4.0–10.5)
nRBC: 0 % (ref 0.0–0.2)

## 2021-11-30 LAB — PREGNANCY, URINE: Preg Test, Ur: NEGATIVE

## 2021-11-30 MED ORDER — OXYCODONE HCL 5 MG PO TABS: 5.0000 mg | ORAL_TABLET | ORAL | 0 refills | Status: DC | PRN

## 2021-11-30 MED ORDER — CLINDAMYCIN HCL 150 MG PO CAPS: 300.0000 mg | ORAL_CAPSULE | Freq: Three times a day (TID) | ORAL | 0 refills | Status: AC

## 2021-11-30 MED ORDER — KETOROLAC TROMETHAMINE 15 MG/ML IJ SOLN
30.0000 mg | Freq: Once | INTRAMUSCULAR | Status: AC
  Administered 2021-11-30: 30 mg via INTRAVENOUS
  Filled 2021-11-30: qty 2

## 2021-11-30 MED ORDER — IOHEXOL 300 MG/ML  SOLN
100.0000 mL | Freq: Once | INTRAMUSCULAR | Status: AC | PRN
  Administered 2021-11-30: 75 mL via INTRAVENOUS

## 2021-11-30 MED ORDER — KETOROLAC TROMETHAMINE 15 MG/ML IJ SOLN
INTRAMUSCULAR | Status: AC
  Filled 2021-11-30: qty 1

## 2021-11-30 NOTE — Progress Notes (Signed)
I spoke with the attending ER physician at the outside urgent care using this patient for parotid swelling.  The patient's primary complaint was pain and swelling.  No fevers.  CT scan shows a 1.4 cm mass along the parotid duct on the right.  I recommended that they placed the patient on warm compresses, hydration, and outpatient antibiotics to cover Staph aureus.  She should have an appointment in 1 week to have this mass evaluated.  I was clear that this can either be a benign or malignant neoplasm and follow-up is strongly recommended.  If the mass persists once the infection has been treated, the patient will likely need an FNA of the lesion and possibly resection.  The patient should be seen by an ENT in 1 to 2 weeks once the infection has resolved.  I have given her Tiburcio Pea phone number at [phone].  His office can facilitate ENT follow-up as recommended.

## 2021-11-30 NOTE — ED Notes (Signed)
Discharge instructions and medications reviewed with patient. Pt states understanding. Questions answered. Ambulatory and discharged Home

## 2021-11-30 NOTE — ED Provider Notes (Signed)
[facility] EMERGENCY DEPARTMENT Provider Note   CSN: [id number] Arrival date & time: 11/30/21  7829     History  Chief Complaint  Patient presents with   Facial Swelling    Heather Sharp is a 31 y.o. female.  HPI      31 year old female with no significant medical history presents with concern for right-sided facial swelling and pain.  She was seen yesterday in the med [facility] emergency department, with symptoms just starting prior to arrival and was recommended compresses.  Reports that the pain significantly worsened overnight.  Her pain is severe.  Denies associated fever, nausea, vomiting.  Denies dental pain.  Denies difficulty swallowing, shortness of breath.  Does not feel that the pain is worsened with eating.  Has pain and swelling to the right side of her jaw which is increasing since it began yesterday.  History reviewed. No pertinent past medical history.   Home Medications Prior to Admission medications   Medication Sig Start Date End Date Taking? Authorizing Provider  clindamycin (CLEOCIN) 150 MG capsule Take 2 capsules (300 mg total) by mouth 3 (three) times daily for 7 days. 11/30/21 12/07/21 Yes Alvira Monday, MD  oxyCODONE (ROXICODONE) 5 MG immediate release tablet Take 1 tablet (5 mg total) by mouth every 4 (four) hours as needed for severe pain. 11/30/21  Yes Alvira Monday, MD  IBUPROFEN PO Take by mouth.    [provider]  metroNIDAZOLE (FLAGYL) 500 MG tablet Take 1 tablet (500 mg total) by mouth 2 (two) times daily. 07/18/17   Robinson, [place] N, PA-C  UNKNOWN TO PATIENT ABX    [provider]      Allergies    Patient has no known allergies.    Review of Systems   Review of Systems  Physical Exam Updated Vital Signs BP 122/72   Pulse 71   Temp 98.2 F (36.8 C)   Resp 16   Ht 5\' 5"  (1.651 m)   Wt 63.5 kg   LMP 11/26/2021 (Exact Date)   SpO2 100%   BMI 23.30 kg/m  Physical Exam Vitals and nursing  note reviewed.  Constitutional:      General: She is not in acute distress.    Appearance: She is well-developed. She is not diaphoretic.  HENT:     Head: Normocephalic and atraumatic.     Comments: Swelling right parotid gland, tenderness Stensons duct with questionable purulence/slight white appearance Eyes:     Conjunctiva/sclera: Conjunctivae normal.  Cardiovascular:     Rate and Rhythm: Normal rate and regular rhythm.     Heart sounds: Normal heart sounds. No murmur heard.    No friction rub. No gallop.  Pulmonary:     Effort: Pulmonary effort is normal. No respiratory distress.     Breath sounds: Normal breath sounds. No stridor. No wheezing or rales.  Abdominal:     General: There is no distension.     Palpations: Abdomen is soft.     Tenderness: There is no abdominal tenderness. There is no guarding.  Musculoskeletal:        General: No tenderness.     Cervical back: Normal range of motion.  Skin:    General: Skin is warm and dry.     Findings: No erythema or rash.  Neurological:     Mental Status: She is alert and oriented to person, place, and time.     ED Results / Procedures / Treatments   Labs (all labs ordered  are listed, but only abnormal results are displayed) Labs Reviewed  CBC WITH DIFFERENTIAL/PLATELET - Abnormal; Notable for the following components:      Result Value   MCV 104.7 (*)    MCH 36.0 (*)    Monocytes Absolute 1.4 (*)    All other components within normal limits  PREGNANCY, URINE  COMPREHENSIVE METABOLIC PANEL    EKG None  Radiology CT Soft Tissue Neck W Contrast  Addendum Date: 11/30/2021   ADDENDUM REPORT: 11/30/2021 14:11 ADDENDUM: Clarification of sialodochitis description in impression: The rounded lesion within the gland is favored to reflect a dilated, inflamed duct given continuity with the likely inflamed Stensen duct. May represent a developing abscess. Electronically Signed   By: Guadlupe Spanish M.D.   On: 11/30/2021 14:11    Result Date: 11/30/2021 CLINICAL DATA:  Soft tissue swelling, infection suspected, neck xray done; right-sided facial swelling near ear EXAM: CT NECK WITH CONTRAST TECHNIQUE: Multidetector CT imaging of the neck was performed using the standard protocol following the bolus administration of intravenous contrast. RADIATION DOSE REDUCTION: This exam was performed according to the departmental dose-optimization program which includes automated exposure control, adjustment of the mA and/or kV according to patient size and/or use of iterative reconstruction technique. CONTRAST:  75mL OMNIPAQUE IOHEXOL 300 MG/ML  SOLN COMPARISON:  None Available. FINDINGS: Pharynx and larynx: Mild prominence of adenoids, palatine tonsils, and lingual tonsils without mass. Larynx is unremarkable. Airway is patent. Salivary glands: Within the right parotid, there is a peripherally enhancing lesion measuring about 1.3 x 1.2 x 1.2 cm. This appears contiguous with the parotid (Stensen) duct. Possible mild hyperenhancement along the duct walls. The no stone. Left parotid and both submandibular glands are unremarkable. Thyroid: Unremarkable. Lymph nodes: No enlarged or abnormal density nodes. Vascular: Major neck vessels are patent. Limited intracranial: No abnormal enhancement. Visualized orbits: Unremarkable. Mastoids and visualized paranasal sinuses: No significant opacification. Skeleton: No acute or significant osseous abnormality. Upper chest: Included upper lungs are clear. Other: Thickening of the right platysma with adjacent inflammatory changes. IMPRESSION: 1.3 cm peripherally enhancing lesion of the right parotid contiguous with the parotid (Stensen) duct. There may be mild hyperenhancement along the duct walls. Mild right facial inflammatory changes are present. 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 Electronically Signed: By: Guadlupe Spanish M.D. On: 11/30/2021 13:52    Procedures Procedures    Medications Ordered in  ED Medications  ketorolac (TORADOL) 15 MG/ML injection 30 mg (30 mg Intravenous Given 11/30/21 1234)  ketorolac (TORADOL) 15 MG/ML injection (  Given 11/30/21 1234)  iohexol (OMNIPAQUE) 300 MG/ML solution 100 mL (75 mLs Intravenous Contrast Given 11/30/21 1323)    ED Course/ Medical Decision Making/ A&P                           Medical Decision Making Amount and/or Complexity of Data Reviewed Labs: ordered. Radiology: ordered.  Risk Prescription drug management.   31 year old female with no significant medical history presents with concern for right-sided facial swelling and pain.  She was seen yesterday in the med [facility] emergency department, with symptoms just starting prior to arrival and was recommended compresses.  Given increasing pain, CT soft tissue neck was obtained to evaluate for signs of abscess, sialolithiasis or other acute etiologies.  CT shows a 1.3 cm peripherally enhancing lesion of the right parotid that is contiguous with Stensen's duct with some mild hyperenhancement along the walls.  Discussed with Dr. Elijah Birk of ear nose and  throat.  He reviewed the imaging, with concern for possible parotid neoplasm causing compression of the duct.  Recommends antibiotics and close outpatient ENT follow-up next week.  Attempted to contact [facility] regarding scheduling, and gave patient this number per Dr. Ledell Peoples recommendations as well as number for [facility] ENT.  She was given a prescription for oxycodone, clindamycin, recommend continuing Tylenol and ibuprofen with close follow-up next week.          Final Clinical Impression(s) / ED Diagnoses Final diagnoses:  Parotid neoplasm  Sialodochitis    Rx / DC Orders ED Discharge Orders          Ordered    clindamycin (CLEOCIN) 150 MG capsule  3 times daily        11/30/21 1434    oxyCODONE (ROXICODONE) 5 MG immediate release tablet  Every 4 hours PRN        11/30/21 1443               Alvira Monday, MD 12/01/21 [phone]

## 2021-11-30 NOTE — ED Notes (Signed)
Pt requesting pain medication. EDP notified and going in to speak with her

## 2021-11-30 NOTE — Discharge Instructions (Addendum)
For your pain, you may take up to 1000mg of acetaminophen (tylenol) 4 times daily for up to a week. This is the maximum dose of acetminophen (tylenol) you can take from all sources. Please check other over-the-counter medications and prescriptions to ensure you are not taking other medications that contain acetaminophen.  You may also take ibuprofen 400 mg 6 times a day OR 600mg 4 times a day alternating with or at the same time as tylenol.  Take oxycodone as needed for breakthrough pain.  This medication can be addicting, sedating and cause constipation.    

## 2021-11-30 NOTE — ED Triage Notes (Signed)
Pt having right sided facial swelling near right ear since yesterday.  Pt seen here yesterday but pt states it has worsened.  No known fever.  No cold symptoms.  No recent piercing.  No ear drainage.

## 2021-12-02 ENCOUNTER — Encounter (HOSPITAL_BASED_OUTPATIENT_CLINIC_OR_DEPARTMENT_OTHER): Payer: Self-pay | Admitting: Emergency Medicine

## 2021-12-02 ENCOUNTER — Emergency Department (HOSPITAL_BASED_OUTPATIENT_CLINIC_OR_DEPARTMENT_OTHER)
Admission: EM | Admit: 2021-12-02 | Discharge: 2021-12-02 | Disposition: A | Discharge status: Home / Self Care | Payer: Medicaid Other | Attending: Emergency Medicine | Admitting: Emergency Medicine

## 2021-12-02 DIAGNOSIS — R220 Localized swelling, mass and lump, head: Secondary | ICD-10-CM | POA: Insufficient documentation

## 2021-12-02 DIAGNOSIS — R519 Headache, unspecified: Secondary | ICD-10-CM | POA: Diagnosis present

## 2021-12-02 MED ORDER — IBUPROFEN 400 MG PO TABS: 400.0000 mg | ORAL_TABLET | Freq: Three times a day (TID) | ORAL | 0 refills | Status: DC

## 2021-12-02 MED ORDER — KETOROLAC TROMETHAMINE 60 MG/2ML IM SOLN
60.0000 mg | Freq: Once | INTRAMUSCULAR | Status: AC
  Administered 2021-12-02: 60 mg via INTRAMUSCULAR
  Filled 2021-12-02: qty 2

## 2021-12-02 NOTE — ED Provider Notes (Signed)
[facility] EMERGENCY DEPARTMENT Provider Note   CSN: [id number] Arrival date & time: 12/02/21  0201     History  Chief Complaint  Patient presents with   Facial Pain    Heather Sharp is a 31 y.o. female.  The history is provided by the patient.  Patient presents with continued right facial pain and swelling.  Reports recently diagnosed with parotid gland inflammation versus infection.  She has been seen at this facility and another facility.  She has been placed on oral antibiotics oxycodone and has referral to otolaryngology early next week However pain is not relieved with oxycodone.  She did have good pain relief with Toradol at the outside facility.  No fevers or vomiting.  She is able to swallow.     Home Medications Prior to Admission medications   Medication Sig Start Date End Date Taking? Authorizing Provider  ibuprofen (ADVIL) 400 MG tablet Take 1 tablet (400 mg total) by mouth 3 (three) times daily. 12/02/21  Yes Ripley Fraise, MD  clindamycin (CLEOCIN) 150 MG capsule Take 2 capsules (300 mg total) by mouth 3 (three) times daily for 7 days. 11/30/21 12/07/21  Gareth Morgan, MD  oxyCODONE (ROXICODONE) 5 MG immediate release tablet Take 1 tablet (5 mg total) by mouth every 4 (four) hours as needed for severe pain. 11/30/21   Gareth Morgan, MD  UNKNOWN TO PATIENT ABX    [provider]      Allergies    Patient has no known allergies.    Review of Systems   Review of Systems  Constitutional:  Negative for fever.  Gastrointestinal:  Negative for vomiting.    Physical Exam Updated Vital Signs BP 137/90   Pulse 61   Temp 97.9 F (36.6 C) (Oral)   Resp 18   LMP 11/26/2021 (Exact Date)   SpO2 100%  Physical Exam CONSTITUTIONAL: Well developed/well nourished HEAD: Normocephalic/atraumatic EYES: EOMI/PERRL ENMT: Mucous membranes moist, tenderness and swelling noted in the preauricular region on the right.  No crepitus or significant  erythema. No trismus, no malocclusion.  No drooling or stridor.  No obvious intraoral swelling NECK: supple no meningeal signs NEURO: Pt is awake/alert/appropriate, moves all extremitiesx4.  No facial droop.   SKIN: warm, color normal PSYCH: no abnormalities of mood noted, alert and oriented to situation  ED Results / Procedures / Treatments   Labs (all labs ordered are listed, but only abnormal results are displayed) Labs Reviewed - No data to display  EKG None  Radiology CT Soft Tissue Neck W Contrast  Addendum Date: 11/30/2021   ADDENDUM REPORT: 11/30/2021 14:11 ADDENDUM: Clarification of sialodochitis description in impression: The rounded lesion within the gland is favored to reflect a dilated, inflamed duct given continuity with the likely inflamed Stensen duct. May represent a developing abscess. Electronically Signed   By: Macy Mis M.D.   On: 11/30/2021 14:11   Result Date: 11/30/2021 CLINICAL DATA:  Soft tissue swelling, infection suspected, neck xray done; right-sided facial swelling near ear EXAM: CT NECK WITH CONTRAST TECHNIQUE: Multidetector CT imaging of the neck was performed using the standard protocol following the bolus administration of intravenous contrast. RADIATION DOSE REDUCTION: This exam was performed according to the departmental dose-optimization program which includes automated exposure control, adjustment of the mA and/or kV according to patient size and/or use of iterative reconstruction technique. CONTRAST:  88m OMNIPAQUE IOHEXOL 300 MG/ML  SOLN COMPARISON:  None Available. FINDINGS: Pharynx and larynx: Mild prominence of adenoids, palatine tonsils, and lingual  tonsils without mass. Larynx is unremarkable. Airway is patent. Salivary glands: Within the right parotid, there is a peripherally enhancing lesion measuring about 1.3 x 1.2 x 1.2 cm. This appears contiguous with the parotid (Stensen) duct. Possible mild hyperenhancement along the duct walls. The no  stone. Left parotid and both submandibular glands are unremarkable. Thyroid: Unremarkable. Lymph nodes: No enlarged or abnormal density nodes. Vascular: Major neck vessels are patent. Limited intracranial: No abnormal enhancement. Visualized orbits: Unremarkable. Mastoids and visualized paranasal sinuses: No significant opacification. Skeleton: No acute or significant osseous abnormality. Upper chest: Included upper lungs are clear. Other: Thickening of the right platysma with adjacent inflammatory changes. IMPRESSION: 1.3 cm peripherally enhancing lesion of the right parotid contiguous with the parotid (Stensen) duct. There may be mild hyperenhancement along the duct walls. Mild right facial inflammatory changes are present. 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 Electronically Signed: By: Macy Mis M.D. On: 11/30/2021 13:52    Procedures Procedures    Medications Ordered in ED Medications  ketorolac (TORADOL) injection 60 mg (60 mg Intramuscular Given 12/02/21 0246)    ED Course/ Medical Decision Making/ A&P                           Medical Decision Making Risk Prescription drug management.   Patient is already been seen at this facility at an outside facility.  She is already on appropriate antibiotics.  She has already had CT imaging that revealed likely abscess she already has follow-up with ENT.  She is in no acute distress.  She reports she only really had relief with Toradol.  She will stop the oxycodone, will start ibuprofen 3 times daily.  Another dose of Toradol given tonight.        Final Clinical Impression(s) / ED Diagnoses Final diagnoses:  Facial pain    Rx / DC Orders ED Discharge Orders          Ordered    ibuprofen (ADVIL) 400 MG tablet  3 times daily        12/02/21 0241              Ripley Fraise, MD 12/02/21 [phone]

## 2021-12-02 NOTE — ED Notes (Signed)
Rx x 1 given  Written and verbal inst to pt  Verbalized an understanding  To home  

## 2021-12-02 NOTE — ED Triage Notes (Signed)
Pt c/o right sided facial pain. Seen here yesterday and state oxycodone isn't helping pain

## 2021-12-02 NOTE — ED Notes (Signed)
ED Provider at bedside. 

## 2022-01-19 ENCOUNTER — Encounter (HOSPITAL_BASED_OUTPATIENT_CLINIC_OR_DEPARTMENT_OTHER): Payer: Self-pay | Admitting: Pediatrics

## 2022-01-19 ENCOUNTER — Emergency Department (HOSPITAL_BASED_OUTPATIENT_CLINIC_OR_DEPARTMENT_OTHER)
Admission: EM | Admit: 2022-01-19 | Discharge: 2022-01-19 | Disposition: A | Discharge status: Home / Self Care | Payer: Medicaid Other | Attending: Emergency Medicine | Admitting: Emergency Medicine

## 2022-01-19 ENCOUNTER — Other Ambulatory Visit: Payer: Self-pay

## 2022-01-19 DIAGNOSIS — R300 Dysuria: Secondary | ICD-10-CM | POA: Diagnosis present

## 2022-01-19 DIAGNOSIS — R109 Unspecified abdominal pain: Secondary | ICD-10-CM | POA: Diagnosis not present

## 2022-01-19 LAB — URINALYSIS, ROUTINE W REFLEX MICROSCOPIC
Bilirubin Urine: NEGATIVE
Glucose, UA: NEGATIVE mg/dL
Hgb urine dipstick: NEGATIVE
Ketones, ur: NEGATIVE mg/dL
Leukocytes,Ua: NEGATIVE
Nitrite: NEGATIVE
Protein, ur: NEGATIVE mg/dL
Specific Gravity, Urine: 1.02 (ref 1.005–1.030)
pH: 6.5 (ref 5.0–8.0)

## 2022-01-19 LAB — COMPREHENSIVE METABOLIC PANEL
ALT: 13 U/L (ref 0–44)
AST: 17 U/L (ref 15–41)
Albumin: 3.8 g/dL (ref 3.5–5.0)
Alkaline Phosphatase: 48 U/L (ref 38–126)
Anion gap: 7 (ref 5–15)
BUN: 11 mg/dL (ref 6–20)
CO2: 24 mmol/L (ref 22–32)
Calcium: 8.9 mg/dL (ref 8.9–10.3)
Chloride: 104 mmol/L (ref 98–111)
Creatinine, Ser: 0.81 mg/dL (ref 0.44–1.00)
GFR, Estimated: >60 mL/min (ref >60)
Glucose, Bld: 86 mg/dL (ref 70–99)
Potassium: 4 mmol/L (ref 3.5–5.1)
Sodium: 135 mmol/L (ref 135–145)
Total Bilirubin: 0.7 mg/dL (ref 0.3–1.2)
Total Protein: 7.2 g/dL (ref 6.5–8.1)

## 2022-01-19 LAB — CBC
HCT: 41.6 % (ref 36.0–46.0)
Hemoglobin: 14.3 g/dL (ref 12.0–15.0)
MCH: 35.9 pg — ABNORMAL HIGH (ref 26.0–34.0)
MCHC: 34.4 g/dL (ref 30.0–36.0)
MCV: 104.5 fL — ABNORMAL HIGH (ref 80.0–100.0)
Platelets: 380 K/uL (ref 150–400)
RBC: 3.98 MIL/uL (ref 3.87–5.11)
RDW: 12.7 % (ref 11.5–15.5)
WBC: 6.8 K/uL (ref 4.0–10.5)
nRBC: 0 % (ref 0.0–0.2)

## 2022-01-19 LAB — PREGNANCY, URINE: Preg Test, Ur: NEGATIVE

## 2022-01-19 LAB — LIPASE, BLOOD: Lipase: 24 U/L (ref 11–51)

## 2022-01-19 NOTE — ED Provider Notes (Signed)
[facility] EMERGENCY DEPARTMENT Provider Note   CSN: [id number] Arrival date & time: 01/19/22  1200     History  Chief Complaint  Patient presents with   Abdominal Pain    Heather Sharp is a 31 y.o. female. With no documented past medical history presents to the emergency department for abdominal pain.  States symptoms began 5 days ago.  She states that she is having pain when she is urinating.  She states that over the past 5 days she has intermittently woken up at night with a sensation that she had to use restroom and having some pressure.  She states that at the end of her urination she will have some stinging pain at the meatus.  She denies having hematuria, abdominal pain, nausea or vomiting, fevers, diarrhea or constipation.  She denies any new vaginal discharge. She has been using Azo with relief of symptoms.    Abdominal Pain Associated symptoms: dysuria        Home Medications Prior to Admission medications   Medication Sig Start Date End Date Taking? Authorizing Provider  ibuprofen (ADVIL) 400 MG tablet Take 1 tablet (400 mg total) by mouth 3 (three) times daily. 12/02/21   Ripley Fraise, MD  oxyCODONE (ROXICODONE) 5 MG immediate release tablet Take 1 tablet (5 mg total) by mouth every 4 (four) hours as needed for severe pain. 11/30/21   Gareth Morgan, MD  UNKNOWN TO PATIENT ABX    [provider]      Allergies    Patient has no known allergies.    Review of Systems   Review of Systems  Genitourinary:  Positive for dysuria.  All other systems reviewed and are negative.   Physical Exam Updated Vital Signs BP 113/75 (BP Location: Left Arm)   Pulse 88   Temp 98.3 F (36.8 C) (Oral)   Resp 18   Ht '5\' 5"'$  (1.651 m)   Wt 63.5 kg   LMP 12/27/2021   SpO2 100%   BMI 23.30 kg/m  Physical Exam Vitals and nursing note reviewed.  Constitutional:      General: She is not in acute distress.    Appearance: Normal appearance. She is  well-developed and normal weight. She is not ill-appearing or toxic-appearing.  HENT:     Head: Normocephalic and atraumatic.     Mouth/Throat:     Mouth: Mucous membranes are moist.  Eyes:     General: No scleral icterus.    Extraocular Movements: Extraocular movements intact.  Pulmonary:     Effort: Pulmonary effort is normal. No respiratory distress.  Abdominal:     General: Abdomen is flat. Bowel sounds are normal. There is no distension.     Palpations: Abdomen is soft.     Tenderness: There is no abdominal tenderness.  Musculoskeletal:        General: Normal range of motion.  Skin:    General: Skin is warm and dry.     Capillary Refill: Capillary refill takes less than 2 seconds.  Neurological:     General: No focal deficit present.     Mental Status: She is alert and oriented to person, place, and time. Mental status is at baseline.  Psychiatric:        Mood and Affect: Mood normal.        Behavior: Behavior normal.        Thought Content: Thought content normal.        Judgment: Judgment normal.    ED Results /  Procedures / Treatments   Labs (all labs ordered are listed, but only abnormal results are displayed) Labs Reviewed  CBC - Abnormal; Notable for the following components:      Result Value   MCV 104.5 (*)    MCH 35.9 (*)    All other components within normal limits  URINALYSIS, ROUTINE W REFLEX MICROSCOPIC  PREGNANCY, URINE  LIPASE, BLOOD  COMPREHENSIVE METABOLIC PANEL   EKG None  Radiology No results found.  Procedures Procedures   Medications Ordered in ED Medications - No data to display  ED Course/ Medical Decision Making/ A&P                           Medical Decision Making Amount and/or Complexity of Data Reviewed Labs: ordered.  This patient presents to the ED with chief complaint(s) of abdominal pain with pertinent past medical history of c-section which further complicates the presenting complaint. The complaint involves an  extensive differential diagnosis and also carries with it a high risk of complications and morbidity.    The differential diagnosis includes Acute hepatobiliary disease, pancreatitis, appendicitis, PUD, gastritis, SBO, diverticulitis, colitis, viral gastroenteritis, Crohn's, UC, vascular catastrophe, UTI, pyelonephritis, renal stone, obstructed stone, infected stone, ovarian torsion, ectopic pregnancy, TOA, PID, STD, etc.    Additional history obtained: Additional history obtained from  none present Records reviewed Care Everywhere/External Records and Primary Care Documents  ED Course and Reassessment: 31 year old female who presents to the emergency department with abdominal pain.  On assessment of the patient she is having pain on urination and what sounds like a stinging pain at the ureteral meatus.  She denies having actual abdominal pain.  Her physical exam is unremarkable.  Her labs are also unremarkable. She may have early UTI, but symptoms have been ongoing for 5 days.  She may just be having ureteral spasm.  She can continue Azo if this is providing her with comfort but do not feel that she needs antibiotics given her UA results.  I will send off a urine culture.  She does have a history of Bactrim resistant UTI.  We will send off culture to ensure there is no growth there. Not pregnant so doubt ectopic.  No infectious symptoms,, fever, leukocytosis concerning for pyelo-, infected stone, PID, TOA.  She has no vaginal discharge concerning for STD.  Again no abdominal pain and work-up was negative so doubt other etiologies including hepatobiliary disease, pancreatitis, appendicitis, SBO, diverticulitis, colitis, viral gastroenteritis, Crohn's, UC, vascular catastrophe.  Patient is agreeable to plan of continuing Azo, plenty of fluids, avoiding antibiotics for now.  Given return precautions for fever and worsening symptoms.  Otherwise can follow-up primary care provider.  She is agreeable to  plan.  Independent labs interpretation:  The following labs were independently interpreted:  CMP within normal limits Lipase 24, negative CBC within normal limits UA negative Urine pregnancy negative  Independent visualization of imaging: - I independently visualized the following imaging with scope of interpretation limited to determining acute life threatening conditions related to emergency care: N/A, which revealed N/A  Consultation: - Consulted or discussed management/test interpretation w/ external professional: N/A  Consideration for admission or further workup: Not indicated Social Determinants of health: None identified Final Clinical Impression(s) / ED Diagnoses Final diagnoses:  Dysuria    Rx / DC Orders ED Discharge Orders     None         Mickie Hillier, PA-C 01/19/22 1525  Deno Etienne, DO 01/19/22 [phone]

## 2022-01-19 NOTE — ED Triage Notes (Signed)
C/O lower abdominal pain on the right side; x 5 days; gets relief from AZO but states symptoms comeback when she stops taking them; concern for UTI.

## 2022-01-19 NOTE — Discharge Instructions (Signed)
You were seen in the emergency department today for pain with urination.  You do not have a UTI.  The rest of your labs are normal.  Please follow-up with your primary care provider.  Please drink plenty of fluids and you can continue Azo if this provides you some comfort.

## 2022-02-16 ENCOUNTER — Encounter (HOSPITAL_BASED_OUTPATIENT_CLINIC_OR_DEPARTMENT_OTHER): Payer: Self-pay | Admitting: Emergency Medicine

## 2022-02-16 ENCOUNTER — Emergency Department (HOSPITAL_BASED_OUTPATIENT_CLINIC_OR_DEPARTMENT_OTHER)
Admission: EM | Admit: 2022-02-16 | Discharge: 2022-02-16 | Disposition: A | Discharge status: Home / Self Care | Payer: Medicaid Other | Attending: Emergency Medicine | Admitting: Emergency Medicine

## 2022-02-16 ENCOUNTER — Other Ambulatory Visit: Payer: Self-pay

## 2022-02-16 ENCOUNTER — Emergency Department (HOSPITAL_BASED_OUTPATIENT_CLINIC_OR_DEPARTMENT_OTHER): Payer: Medicaid Other

## 2022-02-16 DIAGNOSIS — M79662 Pain in left lower leg: Secondary | ICD-10-CM | POA: Insufficient documentation

## 2022-02-16 DIAGNOSIS — M79605 Pain in left leg: Secondary | ICD-10-CM

## 2022-02-16 MED ORDER — CEPHALEXIN 500 MG PO CAPS: 500.0000 mg | ORAL_CAPSULE | Freq: Three times a day (TID) | ORAL | 0 refills | Status: AC

## 2022-02-16 NOTE — ED Triage Notes (Signed)
Redness and swelling mid left leg , pt requesting DVT study . Reports just started on birth control , her doctor suggested DVT study   Denies shortness of breath nor chest pain

## 2022-02-16 NOTE — ED Provider Notes (Signed)
[facility] EMERGENCY DEPARTMENT Provider Note   CSN: [id number] Arrival date & time: 02/16/22  1557     History  Chief Complaint  Patient presents with   Leg Pain    Left     Heather Sharp is a 31 y.o. female.  Patient with no pertinent past medical history presents today with complaints of left lower leg pain.  She states that same has been bothering her for 1 week.  Denies any known trauma to the area.  States that initially it was just painful and then last Saturday she noticed some redness under an old tattoo that seems to be getting worse.  States that she just started birth control and was concerned for DVT.  Presents today to rule out same.  Denies fevers, chills, chest pain, shortness of breath, nausea, or vomiting.  Denies any history of diabetes or IVDU.  The history is provided by the patient. No language interpreter was used.  Leg Pain      Home Medications Prior to Admission medications   Medication Sig Start Date End Date Taking? Authorizing Provider  ibuprofen (ADVIL) 400 MG tablet Take 1 tablet (400 mg total) by mouth 3 (three) times daily. 12/02/21   Ripley Fraise, MD  oxyCODONE (ROXICODONE) 5 MG immediate release tablet Take 1 tablet (5 mg total) by mouth every 4 (four) hours as needed for severe pain. 11/30/21   Gareth Morgan, MD  UNKNOWN TO PATIENT ABX    [provider]      Allergies    Patient has no known allergies.    Review of Systems   Review of Systems  All other systems reviewed and are negative.   Physical Exam Updated Vital Signs BP 126/78 (BP Location: Left Arm)   Pulse 65   Temp 98.1 F (36.7 C) (Oral)   Resp 20   Ht '5\' 5"'$  (1.651 m)   Wt 59 kg   LMP 01/28/2022 (Exact Date)   SpO2 100%   BMI 21.63 kg/m  Physical Exam Vitals and nursing note reviewed.  Constitutional:      General: She is not in acute distress.    Appearance: Normal appearance. She is normal weight. She is not ill-appearing,  toxic-appearing or diaphoretic.  HENT:     Head: Normocephalic and atraumatic.  Cardiovascular:     Rate and Rhythm: Normal rate.  Pulmonary:     Effort: Pulmonary effort is normal. No respiratory distress.  Musculoskeletal:        General: Normal range of motion.     Cervical back: Normal range of motion.  Skin:    General: Skin is warm and dry.     Comments: Mild left lower calf tenderness to palpation with trace redness underneath an old tattoo.  No warmth, fluctuance, or induration.  No palpable abnormalities.  DP and PT pulses intact and 2+.  ROM intact without pain.  Neurological:     General: No focal deficit present.     Mental Status: She is alert.  Psychiatric:        Mood and Affect: Mood normal.        Behavior: Behavior normal.     ED Results / Procedures / Treatments   Labs (all labs ordered are listed, but only abnormal results are displayed) Labs Reviewed - No data to display  EKG None  Radiology US Venous Img Lower Unilateral Left  Result Date: 02/16/2022 CLINICAL DATA:  Redness and swelling in the left lower extremity. EXAM: Left  LOWER EXTREMITY VENOUS DOPPLER ULTRASOUND TECHNIQUE: Gray-scale sonography with compression, as well as color and duplex ultrasound, were performed to evaluate the deep venous system(s) from the level of the common femoral vein through the popliteal and proximal calf veins. COMPARISON:  None Available. FINDINGS: VENOUS Normal compressibility of the common femoral, superficial femoral, and popliteal veins, as well as the visualized calf veins. Visualized portions of profunda femoral vein and great saphenous vein unremarkable. No filling defects to suggest DVT on grayscale or color Doppler imaging. Doppler waveforms show normal direction of venous flow, normal respiratory plasticity and response to augmentation. Limited views of the contralateral common femoral vein are unremarkable. OTHER Borderline enlarged left inguinal node with normal  fatty hilum likely reactive. Limitations: none IMPRESSION: Negative venous Doppler examination for left lower extremity deep venous thrombosis. Electronically Signed   By: Marijo Sanes M.D.   On: 02/16/2022 17:08    Procedures Procedures    Medications Ordered in ED Medications - No data to display  ED Course/ Medical Decision Making/ A&P                           Medical Decision Making  Patient presents today with left calf pain and redness.  She is afebrile, nontoxic-appearing, in no acute distress with reassuring vital signs.  Physical exam reveals trace redness with mild tenderness to palpation.  No fluctuance or induration concerning for abscess.  DVT study negative. I have personally reviewed and interpreted this imaging and agree with radiology interpretation. Patient has no symptoms to suggest PE. No further emergent concerns. Patient is low risk age group with no comorbid conditions. Plan to treat with anti-inflammatories and ice. Will also give prescription for Kelfex for patient to fill only if symptoms worsen per patients request. Patient is understanding and amenable with plan, educated on red flag symptoms that would prompt immediate return. Discharged in stable condition.   Final Clinical Impression(s) / ED Diagnoses Final diagnoses:  Left leg pain    Rx / DC Orders ED Discharge Orders          Ordered    cephALEXin (KEFLEX) 500 MG capsule  3 times daily        02/16/22 1800          An After Visit Summary was printed and given to the patient.     Nestor Lewandowsky 02/16/22 1800    Fredia Sorrow, MD 02/18/22 [phone]

## 2022-02-16 NOTE — Discharge Instructions (Signed)
Ultrasound imaging of your left calf did not show any concern for DVT. I recommend that you ice your leg and take ibuprofen for pain. I have also given you a prescription for an antibiotic for you to take only if your symptoms worsen. Follow-up with your pcp for continued evaluation and management of your symptoms.   Return if development of any new or worsening symptoms.

## 2022-02-16 NOTE — ED Notes (Signed)
Reviewed discharge instructions with pt. States understanding.No questions. Pt ambulatory for discharge

## 2022-06-05 ENCOUNTER — Emergency Department (HOSPITAL_BASED_OUTPATIENT_CLINIC_OR_DEPARTMENT_OTHER): Payer: Medicaid Other

## 2022-06-05 ENCOUNTER — Other Ambulatory Visit: Payer: Self-pay

## 2022-06-05 ENCOUNTER — Encounter (HOSPITAL_BASED_OUTPATIENT_CLINIC_OR_DEPARTMENT_OTHER): Payer: Self-pay | Admitting: Emergency Medicine

## 2022-06-05 ENCOUNTER — Emergency Department (HOSPITAL_BASED_OUTPATIENT_CLINIC_OR_DEPARTMENT_OTHER)
Admission: EM | Admit: 2022-06-05 | Discharge: 2022-06-05 | Disposition: A | Discharge status: Home / Self Care | Payer: Medicaid Other | Attending: Emergency Medicine | Admitting: Emergency Medicine

## 2022-06-05 DIAGNOSIS — M25461 Effusion, right knee: Secondary | ICD-10-CM | POA: Diagnosis not present

## 2022-06-05 DIAGNOSIS — M25561 Pain in right knee: Secondary | ICD-10-CM | POA: Diagnosis present

## 2022-06-05 MED ORDER — DICLOFENAC SODIUM 75 MG PO TBEC: 75.0000 mg | DELAYED_RELEASE_TABLET | Freq: Two times a day (BID) | ORAL | 0 refills | Status: AC

## 2022-06-05 NOTE — ED Triage Notes (Signed)
Pt POV c/o right leg pain, knee swelling x3 weeks. Reports pain radiates from knee to calf, worse with movement.  Denies redness, heat, numbness or tingling. Pt walks a lot at work. No known injury.   Pt takes birth control, smoker.

## 2022-06-05 NOTE — ED Notes (Signed)
RK swollen 2x weeks. Whole leg hurts. Ice packs and ibuprofen didn't not resolve entirely. Icepacks had most effect.

## 2022-06-05 NOTE — ED Provider Notes (Signed)
[facility] EMERGENCY DEPARTMENT Provider Note   CSN: [id number] Arrival date & time: 06/05/22  1406     History  Chief Complaint  Patient presents with   Leg Pain    Heather Sharp is a 32 y.o. female.  Pt complains of right knee pain for 3 days.  Pt denies any injury.  Pt states she was does bend and kneel at work.    The history is provided by the patient. No language interpreter was used.  Leg Pain Location:  Knee Time since incident:  3 days Injury: no   Knee location:  R knee Pain details:    Quality:  Aching   Severity:  Moderate   Onset quality:  Gradual   Timing:  Constant   Progression:  Worsening Chronicity:  New Worsened by:  Nothing Ineffective treatments:  None tried      Home Medications Prior to Admission medications   Medication Sig Start Date End Date Taking? Authorizing Provider  diclofenac (VOLTAREN) 75 MG EC tablet Take 1 tablet (75 mg total) by mouth 2 (two) times daily. 06/05/22  Yes Fransico Meadow, PA-C      Allergies    Patient has no known allergies.    Review of Systems   Review of Systems  All other systems reviewed and are negative.   Physical Exam Updated Vital Signs BP 117/70 (BP Location: Right Arm)   Pulse 79   Temp 99.3 F (37.4 C) (Oral)   Resp 16   Ht '5\' 5"'$  (1.651 m)   Wt 59 kg   LMP 05/19/2022 (Approximate)   SpO2 97%   BMI 21.63 kg/m  Physical Exam Vitals and nursing note reviewed.  Constitutional:      Appearance: She is well-developed.  HENT:     Head: Normocephalic.  Cardiovascular:     Rate and Rhythm: Normal rate.  Pulmonary:     Effort: Pulmonary effort is normal.  Abdominal:     General: There is no distension.  Musculoskeletal:        General: Swelling and tenderness present.     Cervical back: Normal range of motion.     Comments: Effusion right knee,  from  no medial or lateral instabiltiy   Skin:    General: Skin is warm.  Neurological:     General: No focal deficit present.      Mental Status: She is alert and oriented to person, place, and time.  Psychiatric:        Mood and Affect: Mood normal.     ED Results / Procedures / Treatments   Labs (all labs ordered are listed, but only abnormal results are displayed) Labs Reviewed - No data to display  EKG None  Radiology DG Knee Complete 4 Views Right  Result Date: 06/05/2022 CLINICAL DATA:  Swelling, no known injury. EXAM: RIGHT KNEE - COMPLETE 4+ VIEW COMPARISON:  None Available. FINDINGS: No evidence of fracture dislocation. Small joint effusion. No evidence of arthropathy or other focal bone abnormality. Soft tissues are unremarkable. IMPRESSION: No fracture or dislocation of the right knee. Small joint effusion. Electronically Signed   By: Audie Pinto M.D.   On: 06/05/2022 15:12    Procedures Procedures    Medications Ordered in ED Medications - No data to display  ED Course/ Medical Decision Making/ A&P  Medical Decision Making Pt complains of right knee swelling and pain   Amount and/or Complexity of Data Reviewed Radiology: ordered and independent interpretation performed. Decision-making details documented in ED Course.    Details: Xray  no fracture   Risk Prescription drug management. Risk Details: Pt given note for work.  Pt given knee imbolizer and rx for voltaren.             Final Clinical Impression(s) / ED Diagnoses Final diagnoses:  Knee effusion, right    Rx / DC Orders ED Discharge Orders          Ordered    diclofenac (VOLTAREN) 75 MG EC tablet  2 times daily        06/05/22 1621           An After Visit Summary was printed and given to the patient.    Fransico Meadow, PA-C 06/05/22 [place], [place], DO 06/05/22 2253

## 2022-06-05 NOTE — ED Notes (Signed)
Discharge paperwork reviewed entirely with patient, including Rx's and follow up care. Pain was under control. Pt verbalized understanding as well as all parties involved. No questions or concerns voiced at the time of discharge. No acute distress noted.   Pt ambulated out to PVA without incident or assistance.

## 2022-08-24 IMAGING — DX DG LUMBAR SPINE 2-3V
3 series · 3 of 3 positions shown · non-contrast
Comparison: Abdominal radiographs 10/15/2011.

CLINICAL DATA: Leg pain. Additional history provided by scanning
technologist: Patient reports aching left leg pain for 1 week,
intermittent right foot numbness.

EXAM:
LUMBAR SPINE - 2-3 VIEW

[l-spine ap]
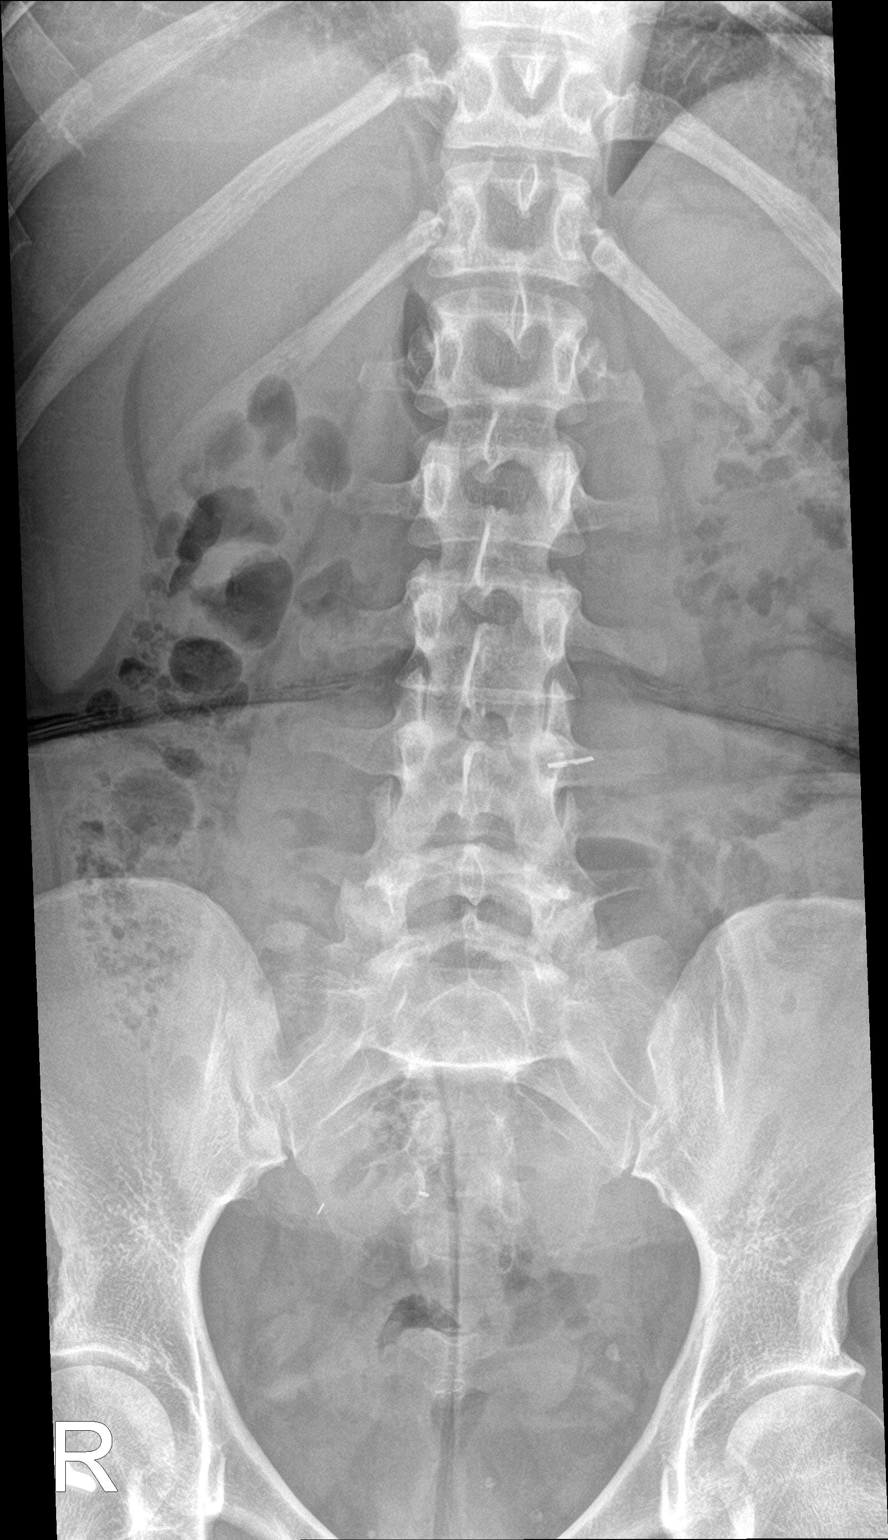

[l-spine lat]
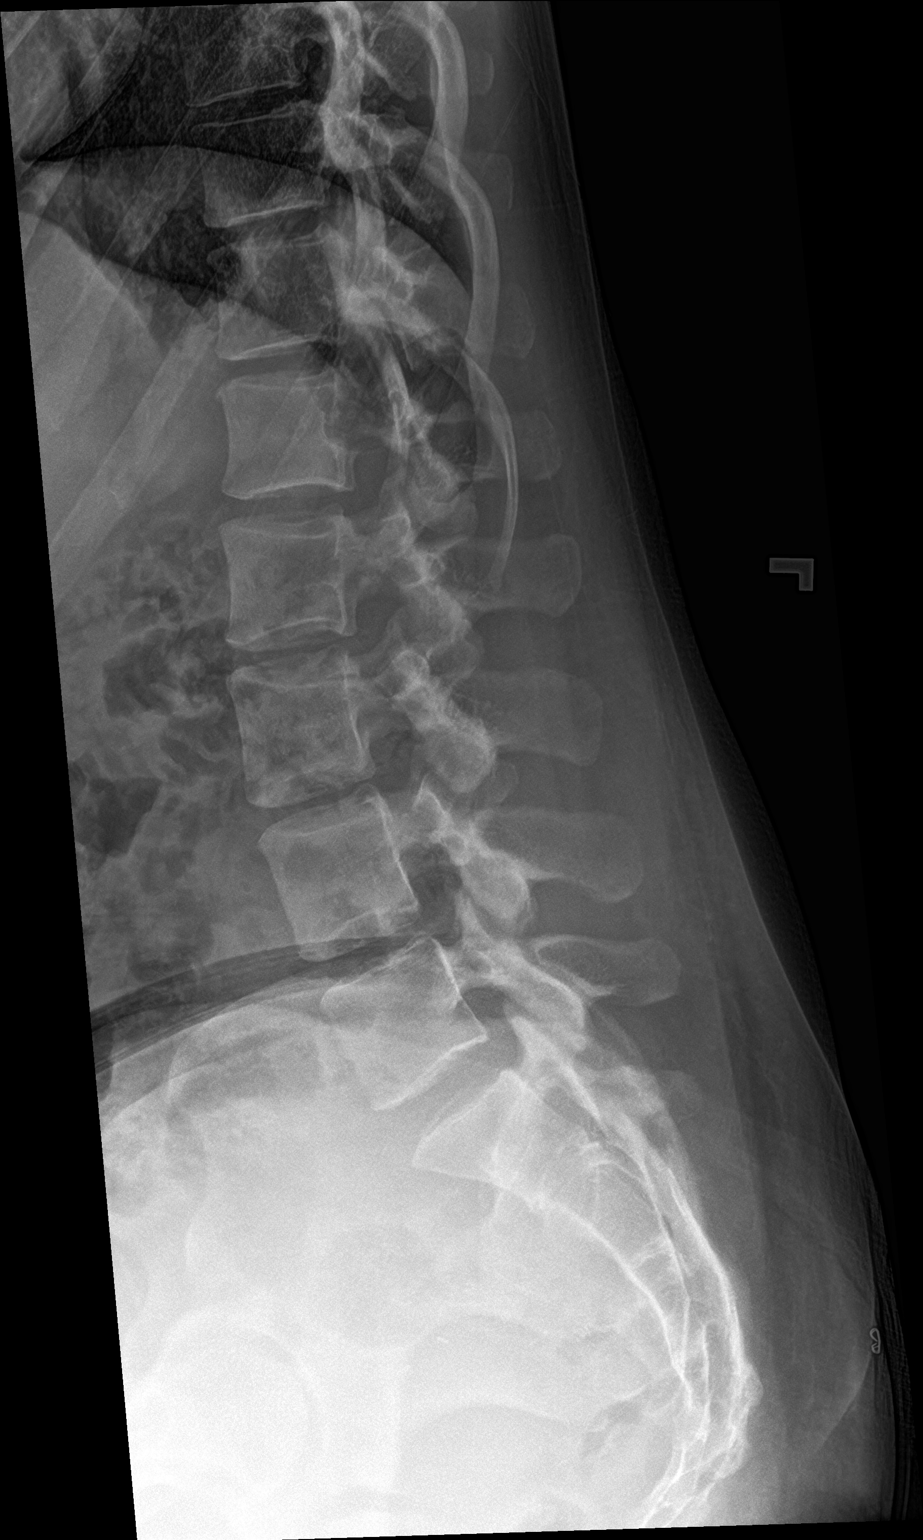

[l-spine spot]
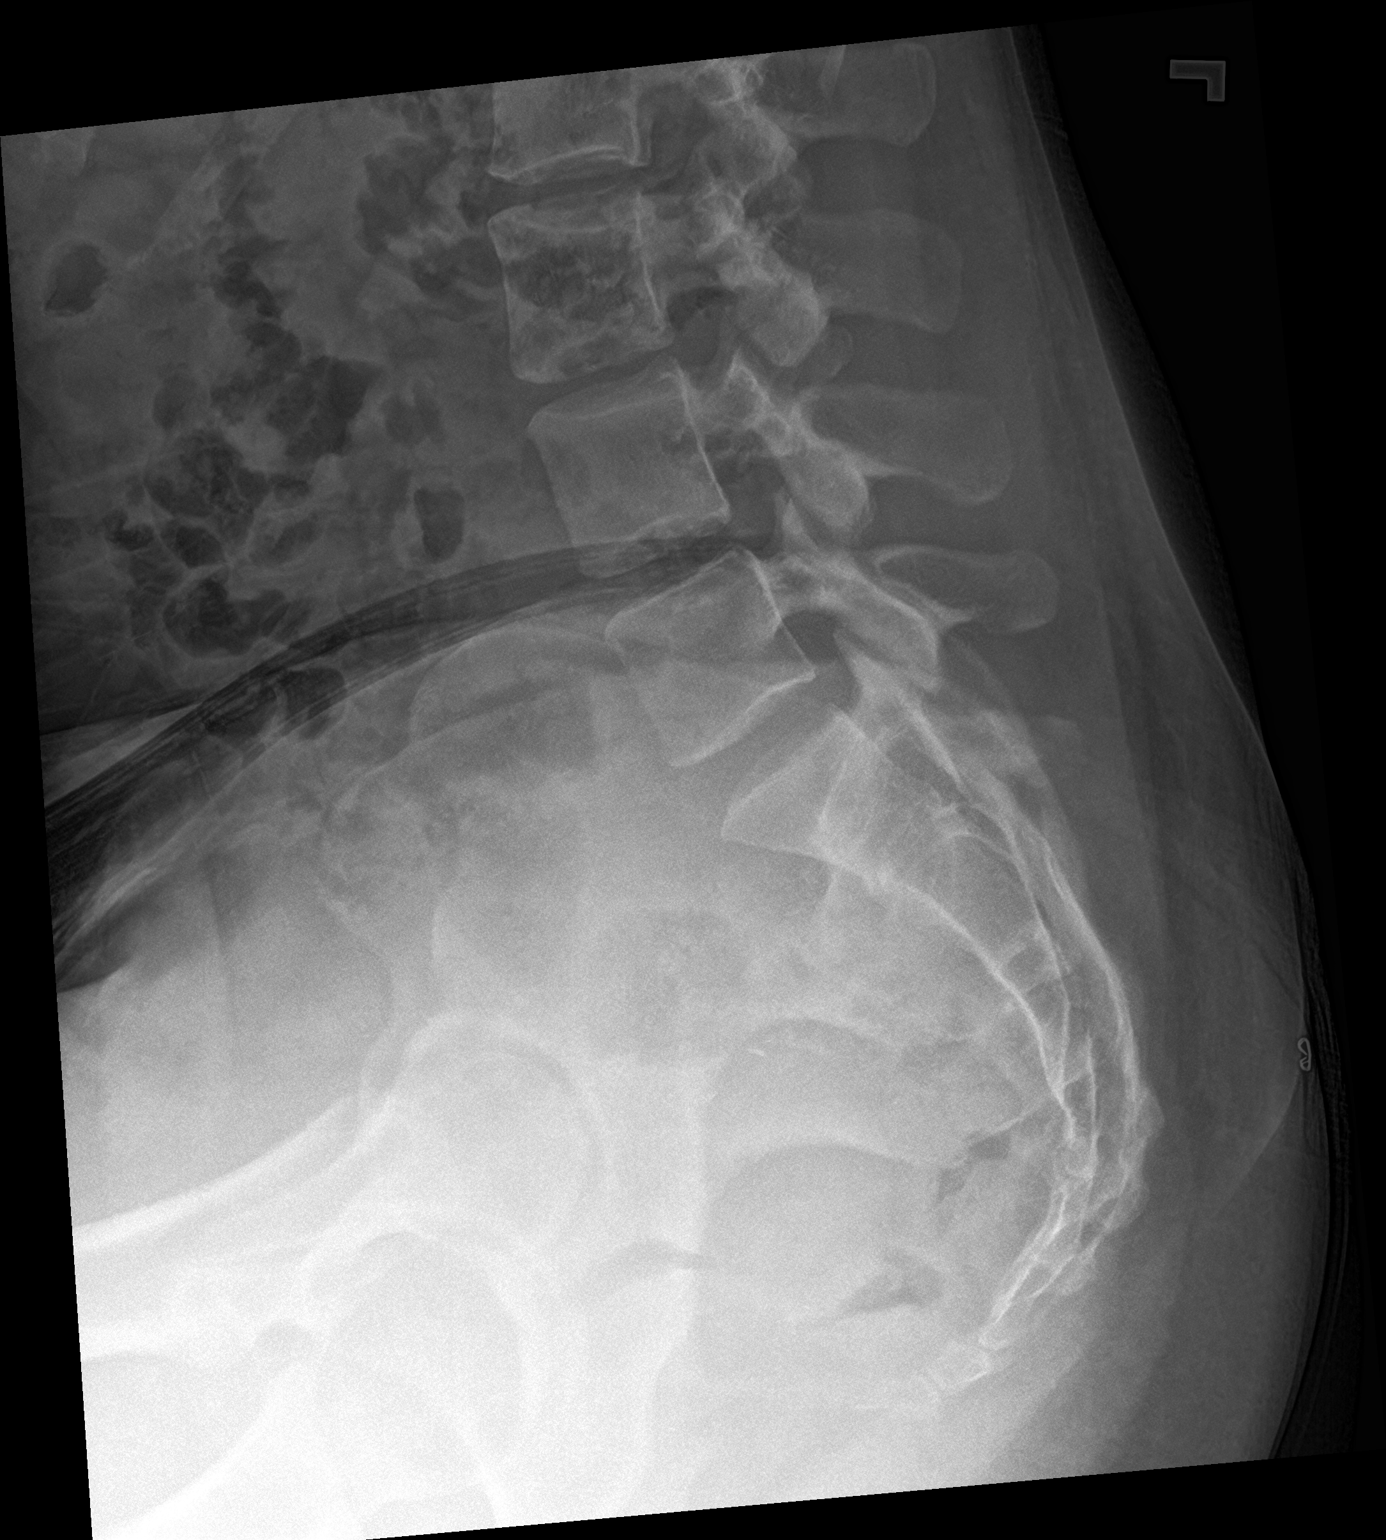

[3 of 3 positions shown; findings below may reference images not displayed]

FINDINGS: Five lumbar vertebrae. The caudal most well-formed intervertebral
disc space is designated L5-S1.

Trace L1-L2 and L2-L3 grade 1 retrolisthesis.

Vertebral body height is maintained. No radiographic evidence of
acute fracture to the lumbar spine.

The intervertebral disc spaces are maintained.

No appreciable significant facet arthrosis.

A 12 mm linear metallic foreign body projects over the left
hemiabdomen on the AP radiograph.
IMPRESSION: No radiographic evidence of fracture to the lumbar spine.

No appreciable significant lumbar spondylosis.

Trace L1-L2 and L2-L3 grade 1 retrolisthesis.

A 12 mm linear metallic foreign body projects over the left
hemiabdomen on the AP radiograph. This may overlie the patient or
may reflect a surgical clip. An ingested foreign body cannot be
definitively excluded and clinical correlation is recommended.
Dedicated AP and lateral abdominal radiographs may be obtained for
further evaluation, as clinically warranted.

## 2022-08-24 IMAGING — US US EXTREM LOW VENOUS*L*
1 series · 14 of 24 positions shown · non-contrast
Comparison: None.

CLINICAL DATA: leg pain

EXAM:
LEFT LOWER EXTREMITY VENOUS DOPPLER ULTRASOUND
TECHNIQUE: Gray-scale sonography with compression, as well as color and duplex
ultrasound, were performed to evaluate the deep venous system(s)
from the level of the common femoral vein through the popliteal and
proximal calf veins.

[Series 1: us extrem low venous*left* · 14 of 42 slices shown]
[im 1/42]
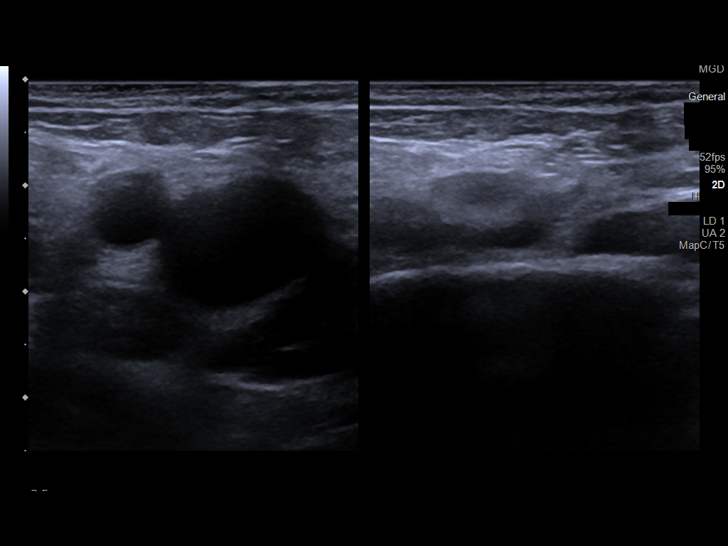
[im 4/42]
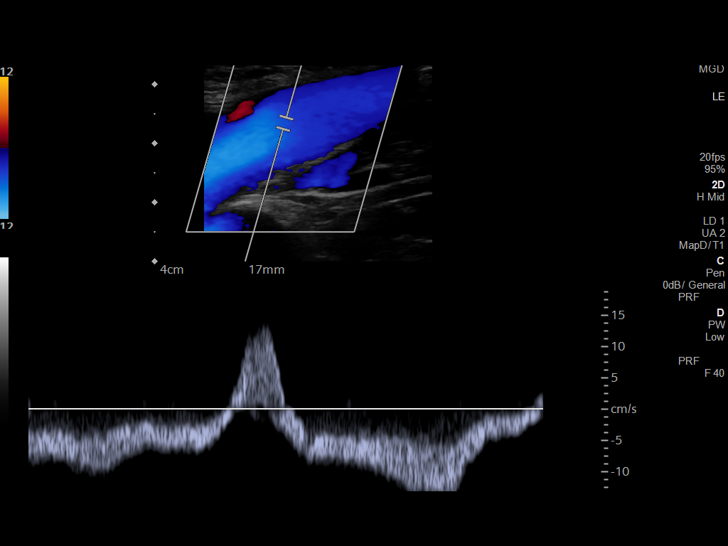
[im 8/42]
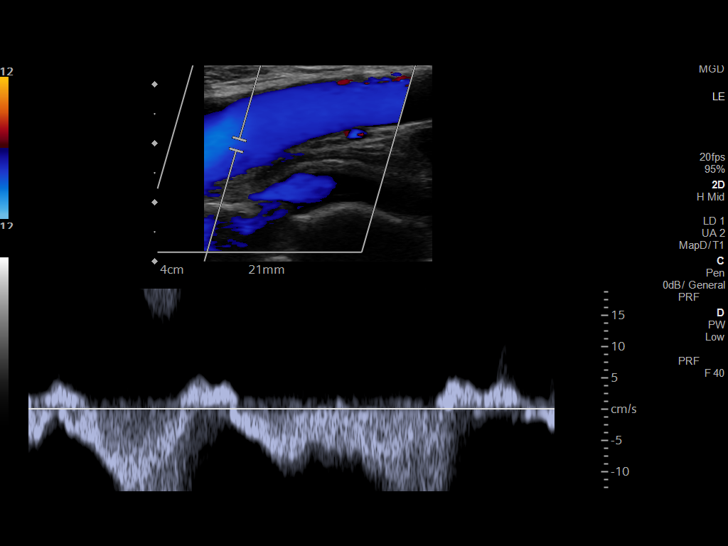
[im 11/42]
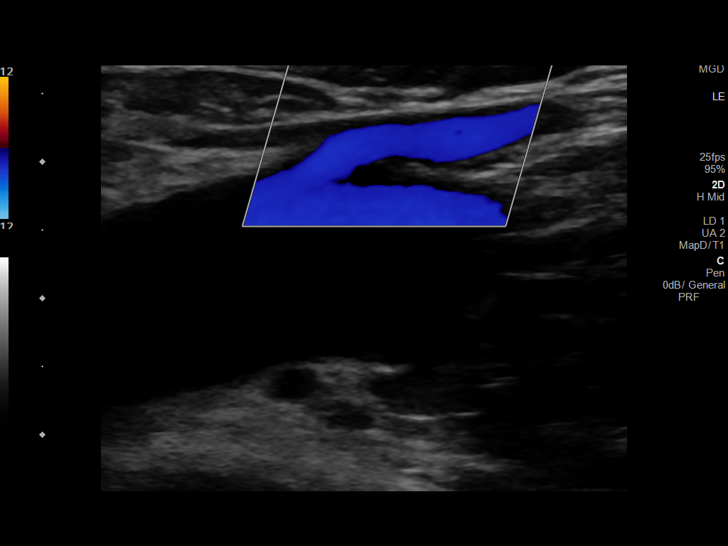
[im 13/42]
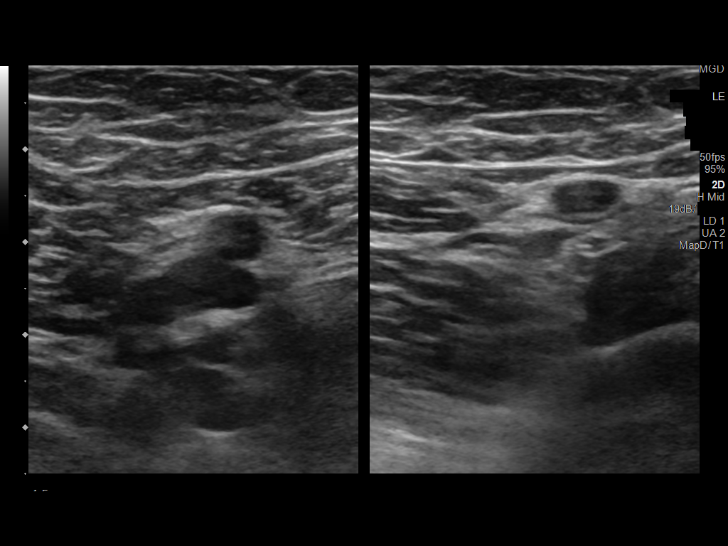
[im 17/42]
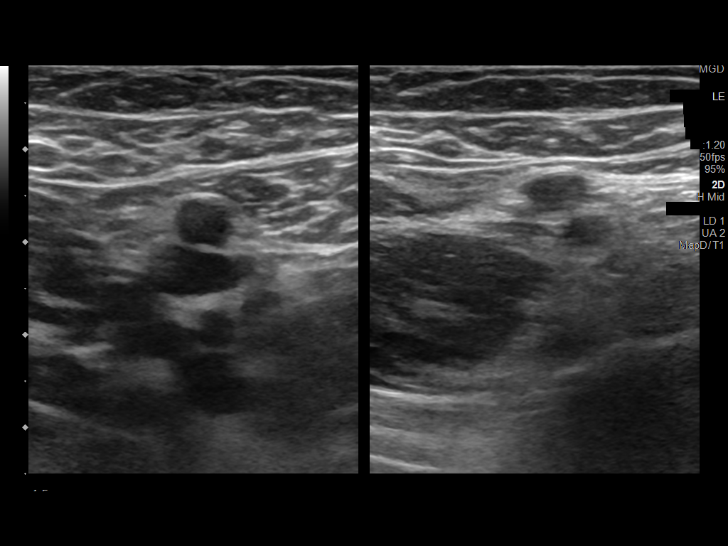
[im 20/42]
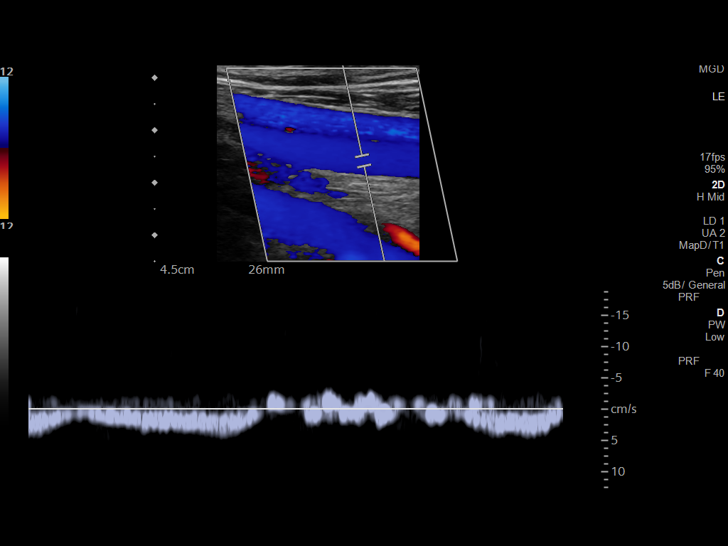
[im 22/42]
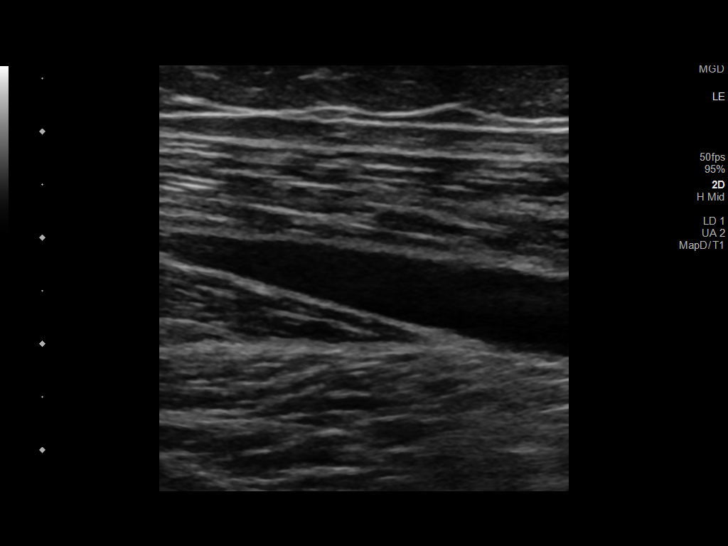
[im 25/42]
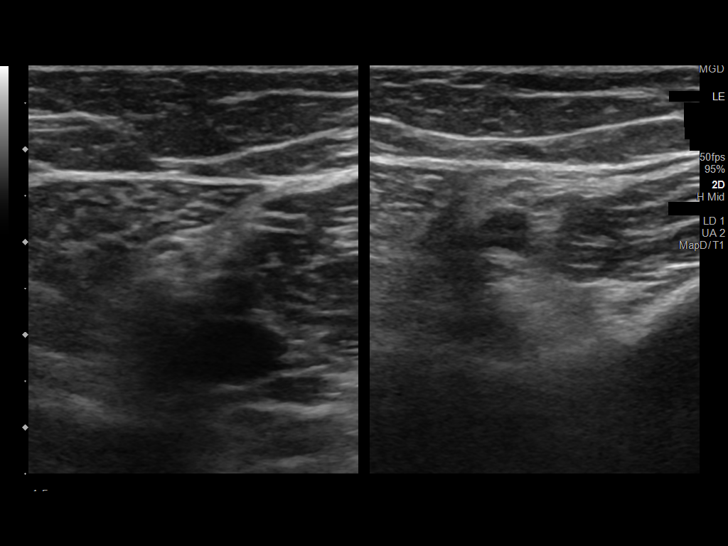
[im 29/42]
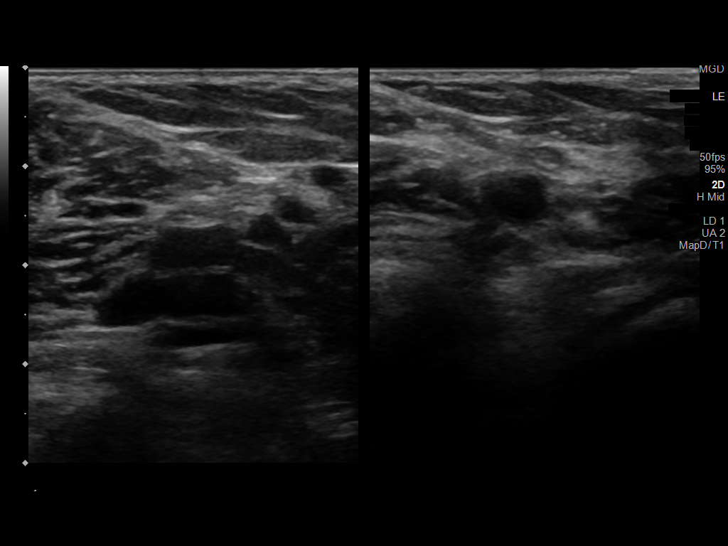
[im 33/42]
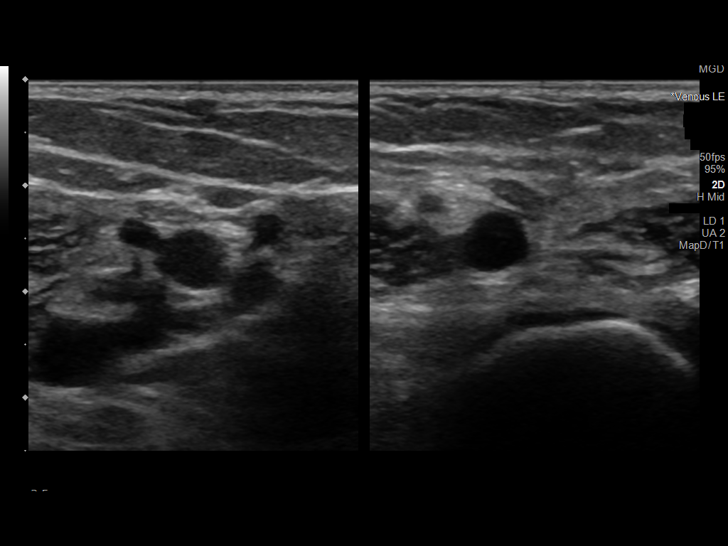
[im 34/42]
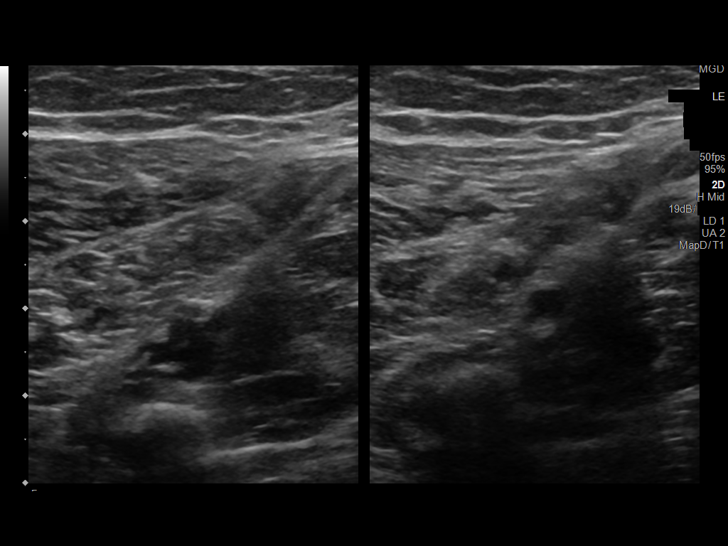
[im 38/42]
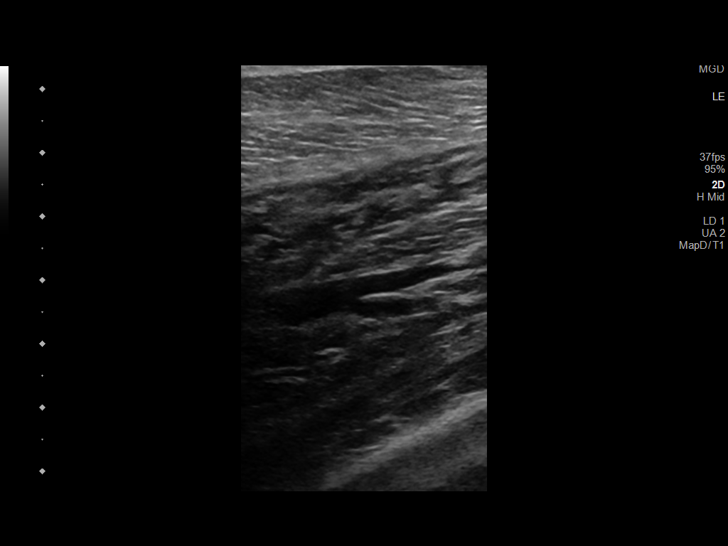
[im 42/42]
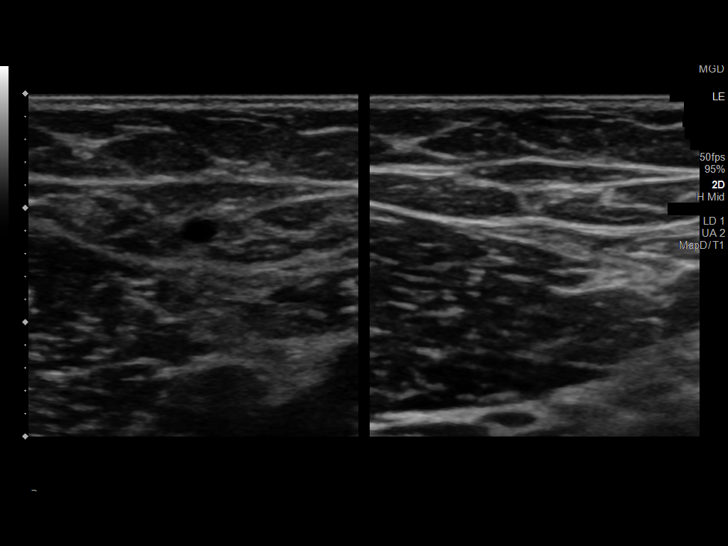

[14 of 24 positions shown; findings below may reference images not displayed]

FINDINGS: VENOUS

Normal compressibility of the common femoral, superficial femoral,
and popliteal veins, as well as the visualized calf veins.
Visualized portions of profunda femoral vein and great saphenous
vein unremarkable. No filling defects to suggest DVT on grayscale or
color Doppler imaging. Doppler waveforms show normal direction of
venous flow, normal respiratory plasticity and response to
augmentation.

Limited views of the contralateral common femoral vein are
unremarkable.

OTHER

None.

Limitations: none
IMPRESSION: Negative.

## 2023-06-12 ENCOUNTER — Encounter (HOSPITAL_BASED_OUTPATIENT_CLINIC_OR_DEPARTMENT_OTHER): Payer: Self-pay

## 2023-06-12 ENCOUNTER — Emergency Department (HOSPITAL_BASED_OUTPATIENT_CLINIC_OR_DEPARTMENT_OTHER)
Admission: EM | Admit: 2023-06-12 | Discharge: 2023-06-12 | Disposition: A | Discharge status: Home / Self Care | Payer: Medicaid Other

## 2023-06-12 ENCOUNTER — Other Ambulatory Visit: Payer: Self-pay

## 2023-06-12 ENCOUNTER — Emergency Department (HOSPITAL_BASED_OUTPATIENT_CLINIC_OR_DEPARTMENT_OTHER): Payer: Medicaid Other

## 2023-06-12 DIAGNOSIS — Z20822 Contact with and (suspected) exposure to covid-19: Secondary | ICD-10-CM | POA: Insufficient documentation

## 2023-06-12 DIAGNOSIS — J09X2 Influenza due to identified novel influenza A virus with other respiratory manifestations: Secondary | ICD-10-CM | POA: Insufficient documentation

## 2023-06-12 DIAGNOSIS — R059 Cough, unspecified: Secondary | ICD-10-CM | POA: Diagnosis present

## 2023-06-12 DIAGNOSIS — J101 Influenza due to other identified influenza virus with other respiratory manifestations: Secondary | ICD-10-CM

## 2023-06-12 LAB — GROUP A STREP BY PCR: Group A Strep by PCR: NOT DETECTED

## 2023-06-12 LAB — RESP PANEL BY RT-PCR (RSV, FLU A&B, COVID)  RVPGX2
Influenza A by PCR: POSITIVE — AB
Influenza B by PCR: NEGATIVE
Resp Syncytial Virus by PCR: NEGATIVE
SARS Coronavirus 2 by RT PCR: NEGATIVE

## 2023-06-12 MED ORDER — BENZONATATE 100 MG PO CAPS: 100.0000 mg | ORAL_CAPSULE | Freq: Three times a day (TID) | ORAL | 0 refills | Status: DC

## 2023-06-12 NOTE — Discharge Instructions (Addendum)
Please read and follow all provided instructions.  Your diagnoses today include:  1. Influenza A     Tests performed today include: Flu, COVID, RSV, strep testing: Positive for influenza A  Chest x-ray: was negative Vital signs. See below for your results today.   Medications prescribed:  Please use over-the-counter NSAID medications (ibuprofen, naproxen) or Tylenol (acetaminophen) as directed on the packaging for pain -- as long as you do not have any reasons avoid these medications. Reasons to avoid NSAID medications include: weak kidneys, a history of bleeding in your stomach or gut, or uncontrolled high blood pressure or previous heart attack. Reasons to avoid Tylenol include: liver problems or ongoing alcohol use. Never take more than 4000mg  or 8 Extra strength Tylenol in a 24 hour period.     Tessalon Perles - cough suppressant medication  Take any prescribed medications only as directed.  Home care instructions:  Follow any educational materials contained in this packet. Please continue drinking plenty of fluids. Use over-the-counter cold and flu medications as needed as directed on packaging for symptom relief. You may also use ibuprofen or tylenol as directed on packaging for pain or fever.   BE VERY CAREFUL not to take multiple medicines containing Tylenol (also called acetaminophen). Doing so can lead to an overdose which can damage your liver and cause liver failure and possibly death.   Follow-up instructions: Please follow-up with your primary care provider in the next 3 days for further evaluation of your symptoms.   Return instructions:  Please return to the Emergency Department if you experience worsening symptoms. Please return if you have a high fever greater than 101 degrees not controlled with over-the-counter medications, persistent vomiting and cannot keep down fluids, or worsening trouble breathing. Please return if you have any other emergent  concerns.  Additional Information:  Your vital signs today were: BP 118/77   Pulse 69   Temp 97.6 F (36.4 C) (Oral)   Resp 16   Wt 66.7 kg   LMP 05/23/2023   SpO2 100%   BMI 24.46 kg/m  If your blood pressure (BP) was elevated above 135/85 this visit, please have this repeated by your doctor within one month.

## 2023-06-12 NOTE — ED Triage Notes (Signed)
Pt reports with body aches since Friday. Sore throat since Saturday. Cough, congestion and HA

## 2023-06-12 NOTE — ED Provider Notes (Signed)
[place] [facility] AT [facility] Provider Note   CSN: [id number] Arrival date & time: 06/12/23  4098     History  Chief Complaint  Patient presents with   Sore Throat   Generalized Body Aches    Heather Sharp is a 33 y.o. female.  Patient presents to the emergency department today for evaluation of flulike illness.  She has had sore throat, body aches and cough over the past 3 days.  She has not had a flu shot this year.  She works around Public affairs consultant.  No vomiting or diarrhea.  No documented fever but she has had chills at times.  She has been using over-the-counter medications without much improvement.       Home Medications Prior to Admission medications   Medication Sig Start Date End Date Taking? Authorizing Provider  benzonatate (TESSALON) 100 MG capsule Take 1 capsule (100 mg total) by mouth every 8 (eight) hours. 06/12/23  Yes Renne Crigler, PA-C  diclofenac (VOLTAREN) 75 MG EC tablet Take 1 tablet (75 mg total) by mouth 2 (two) times daily. 06/05/22   Elson Areas, PA-C      Allergies    Patient has no known allergies.    Review of Systems   Review of Systems  Physical Exam Updated Vital Signs BP 118/77   Pulse 69   Temp 97.6 F (36.4 C) (Oral)   Resp 16   Wt 66.7 kg   LMP 05/23/2023   SpO2 100%   BMI 24.46 kg/m  Physical Exam Vitals and nursing note reviewed.  Constitutional:      Appearance: She is well-developed.  HENT:     Head: Normocephalic and atraumatic.     Jaw: No trismus.     Right Ear: Tympanic membrane, ear canal and external ear normal.     Left Ear: Tympanic membrane, ear canal and external ear normal.     Nose: Nose normal. No mucosal edema or rhinorrhea.     Mouth/Throat:     Mouth: Mucous membranes are moist. Mucous membranes are not dry. No oral lesions.     Pharynx: Uvula midline. Posterior oropharyngeal erythema present. No oropharyngeal exudate or uvula swelling.     Tonsils: No tonsillar abscesses.   Eyes:     General:        Right eye: No discharge.        Left eye: No discharge.     Conjunctiva/sclera: Conjunctivae normal.  Cardiovascular:     Rate and Rhythm: Normal rate and regular rhythm.     Heart sounds: Normal heart sounds.  Pulmonary:     Effort: Pulmonary effort is normal. No respiratory distress.     Breath sounds: Normal breath sounds. No wheezing or rales.  Abdominal:     Palpations: Abdomen is soft.     Tenderness: There is no abdominal tenderness.  Musculoskeletal:     Cervical back: Normal range of motion and neck supple.  Lymphadenopathy:     Cervical: No cervical adenopathy.  Skin:    General: Skin is warm and dry.  Neurological:     Mental Status: She is alert.  Psychiatric:        Mood and Affect: Mood normal.     ED Results / Procedures / Treatments   Labs (all labs ordered are listed, but only abnormal results are displayed) Labs Reviewed  RESP PANEL BY RT-PCR (RSV, FLU A&B, COVID)  RVPGX2 - Abnormal; Notable for the following components:  Result Value   Influenza A by PCR POSITIVE (*)    All other components within normal limits  GROUP A STREP BY PCR    EKG None  Radiology DG Chest 2 View Result Date: 06/12/2023 CLINICAL DATA:  Cough EXAM: CHEST - 2 VIEW COMPARISON:  X-ray 09/13/2016 FINDINGS: No consolidation, pneumothorax or effusion. Normal cardiopericardial silhouette without edema. IMPRESSION: No acute cardiopulmonary disease. Electronically Signed   By: Karen Kays M.D.   On: 06/12/2023 10:13    Procedures Procedures    Medications Ordered in ED Medications - No data to display  ED Course/ Medical Decision Making/ A&P    Patient seen and examined. History obtained directly from patient. Work-up including labs, imaging, EKG ordered in triage, if performed, were reviewed.    Labs/EKG: Independently reviewed and interpreted.  This included: Flu, COVID, RSV testing positive for flu A.  Strep test negative.  Imaging:  Independently reviewed and interpreted.  This included: Chest x-ray, negative  Medications/Fluids: None ordered  Most recent vital signs reviewed and are as follows: BP 118/77   Pulse 69   Temp 97.6 F (36.4 C) (Oral)   Resp 16   Wt 66.7 kg   LMP 05/23/2023   SpO2 100%   BMI 24.46 kg/m   Initial impression: Influenza  Patient discharged to home. Encouraged to rest and drink plenty of fluids.  Patient told to return to ED or see their primary doctor if their symptoms worsen, high fever not controlled with tylenol, persistent vomiting, they feel they are dehydrated, or if they have any other concerns.  Patient verbalized understanding and agreed with plan.    Follow-up instructions discussed with patient: 5 days if not improving                                Medical Decision Making Amount and/or Complexity of Data Reviewed Radiology: ordered.  Risk Prescription drug management.   Patient with symptoms consistent with influenza. Vitals are stable. No signs of dehydration, tolerating PO's. Lungs are clear.  Chest x-ray ordered in triage was negative, do not suspect pneumonia.  Supportive therapy indicated with return if symptoms worsen. Patient counseled.         Final Clinical Impression(s) / ED Diagnoses Final diagnoses:  Influenza A    Rx / DC Orders ED Discharge Orders          Ordered    benzonatate (TESSALON) 100 MG capsule  Every 8 hours        06/12/23 1046              Renne Crigler, PA-C 06/12/23 1049    Durwin Glaze, MD 06/12/23 1453

## 2023-06-12 NOTE — ED Notes (Signed)
ED Provider at bedside. 

## 2023-08-22 ENCOUNTER — Emergency Department (HOSPITAL_BASED_OUTPATIENT_CLINIC_OR_DEPARTMENT_OTHER)
Admission: EM | Admit: 2023-08-22 | Discharge: 2023-08-22 | Disposition: A | Discharge status: Home / Self Care | Attending: Emergency Medicine | Admitting: Emergency Medicine

## 2023-08-22 ENCOUNTER — Emergency Department (HOSPITAL_BASED_OUTPATIENT_CLINIC_OR_DEPARTMENT_OTHER)

## 2023-08-22 ENCOUNTER — Encounter (HOSPITAL_BASED_OUTPATIENT_CLINIC_OR_DEPARTMENT_OTHER): Payer: Self-pay | Admitting: *Deleted

## 2023-08-22 ENCOUNTER — Other Ambulatory Visit: Payer: Self-pay

## 2023-08-22 DIAGNOSIS — R0781 Pleurodynia: Secondary | ICD-10-CM | POA: Diagnosis not present

## 2023-08-22 DIAGNOSIS — R079 Chest pain, unspecified: Secondary | ICD-10-CM | POA: Diagnosis present

## 2023-08-22 LAB — TROPONIN I (HIGH SENSITIVITY): Troponin I (High Sensitivity): <2 ng/L (ref <18)

## 2023-08-22 LAB — CBC
HCT: 39.9 % (ref 36.0–46.0)
Hemoglobin: 13.5 g/dL (ref 12.0–15.0)
MCH: 34.8 pg — ABNORMAL HIGH (ref 26.0–34.0)
MCHC: 33.8 g/dL (ref 30.0–36.0)
MCV: 102.8 fL — ABNORMAL HIGH (ref 80.0–100.0)
Platelets: 340 10*3/uL (ref 150–400)
RBC: 3.88 MIL/uL (ref 3.87–5.11)
RDW: 13.6 % (ref 11.5–15.5)
WBC: 7 10*3/uL (ref 4.0–10.5)
nRBC: 0 % (ref 0.0–0.2)

## 2023-08-22 LAB — D-DIMER, QUANTITATIVE: D-Dimer, Quant: 0.33 ug{FEU}/mL (ref 0.00–0.50)

## 2023-08-22 LAB — BASIC METABOLIC PANEL WITH GFR
Anion gap: 10 (ref 5–15)
BUN: 11 mg/dL (ref 6–20)
CO2: 22 mmol/L (ref 22–32)
Calcium: 9.4 mg/dL (ref 8.9–10.3)
Chloride: 102 mmol/L (ref 98–111)
Creatinine, Ser: 0.75 mg/dL (ref 0.44–1.00)
GFR, Estimated: >60 mL/min (ref >60)
Glucose, Bld: 95 mg/dL (ref 70–99)
Potassium: 4.1 mmol/L (ref 3.5–5.1)
Sodium: 134 mmol/L — ABNORMAL LOW (ref 135–145)

## 2023-08-22 LAB — PREGNANCY, URINE: Preg Test, Ur: NEGATIVE

## 2023-08-22 MED ORDER — KETOROLAC TROMETHAMINE 30 MG/ML IJ SOLN
30.0000 mg | Freq: Once | INTRAMUSCULAR | Status: AC
  Administered 2023-08-22: 30 mg via INTRAVENOUS
  Filled 2023-08-22: qty 1

## 2023-08-22 NOTE — Discharge Instructions (Addendum)
 I recommend you take 600 mg of ibuprofen every 6-8 hours, with a small amount of food, for the next 7 days.  This can help with pain and inflammation.  There is also safe to take Tylenol with this medication, as needed for pain.

## 2023-08-22 NOTE — ED Triage Notes (Signed)
 Pt has had chest pain in under right breast with pain radiating to her back.  Pt states that the pain increases with deep breathing.  Pt also notes a sharp and stabbing pain when swallowing water.  This began Friday but got worse yesterday.

## 2023-08-22 NOTE — ED Notes (Signed)
 D/c paperwork reviewed with pt, including follow up care.  All questions and/or concerns addressed at time of d/c.  No further needs expressed. . Pt verbalized understanding, Ambulatory without assistance to ED exit, NAD.

## 2023-08-22 NOTE — ED Provider Notes (Signed)
 [place] [facility] Provider Note   CSN: [id number] Arrival date & time: 08/22/23  1310     History  Chief Complaint  Patient presents with   Chest Pain    Heather Sharp is a 34 y.o. female presented to ED complaining of chest pain.  Patient reports acute onset of right sharp chest pain that goes towards her shoulder blade, about 3 to 4 days ago.  It hurts with deep inspiration.  It does not hurt with movement.  She has never had this pain before.  It is currently mild to moderate intensity.  She denies coughing, fevers, chills.  She denies nausea, vomiting, dysphagia, abdominal pain.  She denies leg swelling or calf pain, personal or family history of DVT or PE.  HPI     Home Medications Prior to Admission medications   Medication Sig Start Date End Date Taking? Authorizing Provider  benzonatate (TESSALON) 100 MG capsule Take 1 capsule (100 mg total) by mouth every 8 (eight) hours. 06/12/23   Renne Crigler, PA-C  diclofenac (VOLTAREN) 75 MG EC tablet Take 1 tablet (75 mg total) by mouth 2 (two) times daily. 06/05/22   Elson Areas, PA-C      Allergies    Patient has no known allergies.    Review of Systems   Review of Systems  Physical Exam Updated Vital Signs BP 116/70   Pulse (!) 58   Temp 98 F (36.7 C) (Oral)   Resp 15   LMP 08/15/2023   SpO2 100%  Physical Exam Constitutional:      General: She is not in acute distress. HENT:     Head: Normocephalic and atraumatic.  Eyes:     Conjunctiva/sclera: Conjunctivae normal.     Pupils: Pupils are equal, round, and reactive to light.  Cardiovascular:     Rate and Rhythm: Normal rate and regular rhythm.  Pulmonary:     Effort: Pulmonary effort is normal. No respiratory distress.  Abdominal:     General: There is no distension.     Tenderness: There is no abdominal tenderness. There is no guarding or rebound. Negative signs include Murphy's sign.  Skin:    General: Skin is  warm and dry.  Neurological:     General: No focal deficit present.     Mental Status: She is alert. Mental status is at baseline.  Psychiatric:        Mood and Affect: Mood normal.        Behavior: Behavior normal.     ED Results / Procedures / Treatments   Labs (all labs ordered are listed, but only abnormal results are displayed) Labs Reviewed  BASIC METABOLIC PANEL WITH GFR - Abnormal; Notable for the following components:      Result Value   Sodium 134 (*)    All other components within normal limits  CBC - Abnormal; Notable for the following components:   MCV 102.8 (*)    MCH 34.8 (*)    All other components within normal limits  PREGNANCY, URINE  D-DIMER, QUANTITATIVE  TROPONIN I (HIGH SENSITIVITY)    EKG EKG Interpretation Date/Time:  Tuesday August 22 2023 13:19:20 EDT Ventricular Rate:  57 PR Interval:  136 QRS Duration:  84 QT Interval:  394 QTC Calculation: 383 R Axis:   90  Text Interpretation: Sinus bradycardia Rightward axis Borderline ECG When compared with ECG of 13-Sep-2016 12:35, PREVIOUS ECG IS PRESENT No significant change since last tracing Confirmed by  Jacalyn Lefevre ([id number]) on 08/22/2023 1:22:15 PM  Radiology DG Chest 2 View Result Date: 08/22/2023 CLINICAL DATA:  Right lower chest pain radiating to the right scapula EXAM: CHEST - 2 VIEW COMPARISON:  Chest radiograph dated 06/12/2023 FINDINGS: Normal lung volumes. No focal consolidations. No pleural effusion or pneumothorax. The heart size and mediastinal contours are within normal limits. No acute osseous abnormality. IMPRESSION: No active cardiopulmonary disease. Electronically Signed   By: Agustin Cree M.D.   On: 08/22/2023 15:50    Procedures Procedures    Medications Ordered in ED Medications  ketorolac (TORADOL) 30 MG/ML injection 30 mg (30 mg Intravenous Given 08/22/23 1613)    ED Course/ Medical Decision Making/ A&P                                 Medical Decision Making Amount and/or  Complexity of Data Reviewed Labs: ordered. Radiology: ordered.  Risk Prescription drug management.   This patient presents to the ED with concern for pleuritic chest pain. This involves an extensive number of treatment options, and is a complaint that carries with it a high risk of complications and morbidity.  The differential diagnosis includes lymphadenitis versus pneumonia versus viral infection versus pneumothorax pulmonary embolism versus other  I ordered and personally interpreted labs.  The pertinent results include: No emergent findings.  Troponin undetectable.  D-dimer negative.  I ordered imaging studies including x-ray of the chest I independently visualized and interpreted imaging which showed no emergent findings I agree with the radiologist interpretation   Per my interpretation the patient's ECG shows sinus rhythm with questionable right axis deviation, no significant change from prior tracing, no acute ischemic findings  I ordered medication including IV Toradol for chest pain  I have reviewed the patients home medicines and have made adjustments as needed  Test Considered: With a negative D-dimer test no other acute risk factors for PE, do not believe that we need a CT PE study.  Low suspicion for PE, aortic dissection, or life-threatening intrathoracic condition  After the interventions noted above, I reevaluated the patient and found that they have: stayed the same  Social Determinants of Health: patient does smoke and vape, reports that she quit about 7 days ago.  I commended her on this.  Dispostion:  After consideration of the diagnostic results and the patients response to treatment, I feel that the patent would benefit from outpatient follow-up         Final Clinical Impression(s) / ED Diagnoses Final diagnoses:  Pleuritic chest pain    Rx / DC Orders ED Discharge Orders     None         Shell Blanchette, Kermit Balo, MD 08/22/23 1657

## 2023-11-25 ENCOUNTER — Emergency Department (HOSPITAL_BASED_OUTPATIENT_CLINIC_OR_DEPARTMENT_OTHER)
Admission: EM | Admit: 2023-11-25 | Discharge: 2023-11-25 | Disposition: A | Discharge status: Home / Self Care | Attending: Emergency Medicine | Admitting: Emergency Medicine

## 2023-11-25 ENCOUNTER — Other Ambulatory Visit: Payer: Self-pay

## 2023-11-25 ENCOUNTER — Encounter (HOSPITAL_BASED_OUTPATIENT_CLINIC_OR_DEPARTMENT_OTHER): Payer: Self-pay

## 2023-11-25 DIAGNOSIS — F17210 Nicotine dependence, cigarettes, uncomplicated: Secondary | ICD-10-CM | POA: Diagnosis not present

## 2023-11-25 DIAGNOSIS — H9202 Otalgia, left ear: Secondary | ICD-10-CM | POA: Diagnosis present

## 2023-11-25 MED ORDER — CIPROFLOXACIN HCL 0.3 % OP SOLN: 2.0000 [drp] | Freq: Two times a day (BID) | OPHTHALMIC | 0 refills | Status: DC

## 2023-11-25 MED ORDER — CIPROFLOXACIN HCL 0.3 % OP SOLN: 2.0000 [drp] | Freq: Two times a day (BID) | OPHTHALMIC | 0 refills | Status: AC

## 2023-11-25 MED ORDER — DEXAMETHASONE SODIUM PHOSPHATE 0.1 % OP SOLN: OPHTHALMIC | 0 refills | Status: AC

## 2023-11-25 MED ORDER — DEXAMETHASONE SODIUM PHOSPHATE 0.1 % OP SOLN: OPHTHALMIC | 0 refills | Status: DC

## 2023-11-25 NOTE — ED Triage Notes (Signed)
 Patient arrived POV from home with complaint of left ear bleeding.  Patient reports  was cleaning ear with q tip that accidentally got logged deep into ear, once Qtip was removed patient noted bleeding. Reports decreased hearing in ear.

## 2023-11-25 NOTE — Discharge Instructions (Signed)
 As discussed, will put you on antibiotic eardrops for prevention of infection.  Avoid placing objects in your ears to avoid additional trauma.  Will attach number for ENT specialist to follow-up with for reassessment.  Please do not hesitate to return to emergency department if the worrisome signs and symptoms we discussed become apparent.

## 2023-11-25 NOTE — ED Provider Notes (Signed)
 [place] [facility] Provider Note   CSN: [id number] Arrival date & time: 11/25/23  1948     Patient presents with: Ear Drainage   Heather Sharp is a 33 y.o. female.    Ear Drainage   33 year old female presents emergency department complaints of left ear pain, bleeding from left ear.  States that she felt like her left ear was clogged and was attempted to clean it with a Q-tip.  States that the Q-tip subsequently got stuck in her ear.  She was able to pull it out but noticed bleeding afterwards.  States that her hearing is slightly decreased on that side at first but seems to be getting back to normal.  Denies any pain/trauma elsewhere.  No significant pertinent past medical history.  Prior to Admission medications   Medication Sig Start Date End Date Taking? Authorizing Provider  benzonatate  (TESSALON ) 100 MG capsule Take 1 capsule (100 mg total) by mouth every 8 (eight) hours. 06/12/23   Desiderio Chew, PA-C  ciprofloxacin  (CILOXAN ) 0.3 % ophthalmic solution Place 2 drops into the left ear 2 (two) times daily for 7 days. Administer 1 drop, every 2 hours, while awake, for 2 days. Then 1 drop, every 4 hours, while awake, for the next 5 days. 11/25/23 12/02/23  Silver Wonda LABOR, PA  dexamethasone  (DECADRON ) 0.1 % ophthalmic solution Place 2 drops in left ear every 12 hours for 7 days 11/25/23   Silver Wonda A, PA  diclofenac  (VOLTAREN ) 75 MG EC tablet Take 1 tablet (75 mg total) by mouth 2 (two) times daily. 06/05/22   Sofia, Leslie K, PA-C    Allergies: Patient has no known allergies.    Review of Systems  All other systems reviewed and are negative.   Updated Vital Signs BP 136/88   Pulse 72   Temp 98 F (36.7 C) (Oral)   Resp 16   Ht 5' 5 (1.651 m)   Wt 68 kg   LMP 11/19/2023 (Exact Date)   SpO2 100%   BMI 24.96 kg/m   Physical Exam Vitals and nursing note reviewed.  Constitutional:      General: She is not in acute distress.     Appearance: She is well-developed.  HENT:     Head: Normocephalic and atraumatic.     Ears:     Comments: TM appears intact bilaterally.  Patient with scabbing, dried blood appreciated inferior aspect of left sided external auditory canal.  Maceration present left-sided external auditory canal.  External ear unremarkable. Eyes:     Conjunctiva/sclera: Conjunctivae normal.  Cardiovascular:     Rate and Rhythm: Normal rate and regular rhythm.     Heart sounds: No murmur heard. Pulmonary:     Effort: Pulmonary effort is normal. No respiratory distress.     Breath sounds: Normal breath sounds.  Abdominal:     Palpations: Abdomen is soft.     Tenderness: There is no abdominal tenderness.  Musculoskeletal:        General: No swelling.     Cervical back: Neck supple.  Skin:    General: Skin is warm and dry.     Capillary Refill: Capillary refill takes less than 2 seconds.  Neurological:     Mental Status: She is alert.  Psychiatric:        Mood and Affect: Mood normal.     (all labs ordered are listed, but only abnormal results are displayed) Labs Reviewed - No data to display  EKG: None  Radiology: No results found.   Procedures   Medications Ordered in the ED - No data to display                                  Medical Decision Making Risk Prescription drug management.   This patient presents to the ED for concern of ear pain/drainage, this involves an extensive number of treatment options, and is a complaint that carries with it a high risk of complications and morbidity.  The differential diagnosis includes otitis media, otitis externa, cellulitis, abscess, perforated TM, mastoiditis, other   Co morbidities that complicate the patient evaluation  See HPI   Additional history obtained:  Additional history obtained from EMR External records from outside source obtained and reviewed including hospital records   Lab Tests:  N/a   Imaging Studies  ordered:  N/a   Cardiac Monitoring: / EKG:  N/a   Consultations Obtained:  N/a   Problem List / ED Course / Critical interventions / Medication management  Left ear pain/bleeding Reevaluation of the patient  showed that the patient stayed the same I have reviewed the patients home medicines and have made adjustments as needed   Social Determinants of Health:  Cigarette use.  Denies illicit drug use.   Test / Admission - Considered:  Left ear pain/bleeding Vitals signs within normal range and stable throughout visit. 33 year old female presents emergency department complaints of left ear pain, bleeding from left ear.  States that she felt like her left ear was clogged and was attempted to clean it with a Q-tip.  States that the Q-tip subsequently got stuck in her ear.  She was able to pull it out but noticed bleeding afterwards.  States that her hearing is slightly decreased on that side at first but seems to be getting back to normal.  Denies any pain/trauma elsewhere. On exam, dried blood appreciated left external auditory canal with minimal maceration present..  TMs appear to be intact bilaterally.   No evidence clinically of otitis media.  Question of possible otitis externa.  Will treat empirically with topical antibiotics given traumatic injury with superficial wounds present external auditory canal.  Will recommend follow-up with PCP/ENT in the outpatient setting.  Treatment plan discussed with patient and she acknowledged and standing was agreeable to said plan.  Patient well-appearing, afebrile in no acute distress. Worrisome signs and symptoms were discussed with the patient, and the patient acknowledged understanding to return to the ED if noticed. Patient was stable upon discharge.       Final diagnoses:  Left ear pain          Silver Wonda LABOR, [place] 11/25/23 2117    Francesca Elsie CROME, MD 11/26/23 [phone]

## 2024-01-26 ENCOUNTER — Emergency Department (HOSPITAL_BASED_OUTPATIENT_CLINIC_OR_DEPARTMENT_OTHER)
Admission: EM | Admit: 2024-01-26 | Discharge: 2024-01-26 | Disposition: A | Discharge status: Home / Self Care | Attending: Emergency Medicine | Admitting: Emergency Medicine

## 2024-01-26 ENCOUNTER — Other Ambulatory Visit: Payer: Self-pay

## 2024-01-26 ENCOUNTER — Encounter (HOSPITAL_BASED_OUTPATIENT_CLINIC_OR_DEPARTMENT_OTHER): Payer: Self-pay | Admitting: Urology

## 2024-01-26 DIAGNOSIS — J029 Acute pharyngitis, unspecified: Secondary | ICD-10-CM | POA: Diagnosis present

## 2024-01-26 DIAGNOSIS — J020 Streptococcal pharyngitis: Secondary | ICD-10-CM | POA: Diagnosis not present

## 2024-01-26 LAB — RESP PANEL BY RT-PCR (RSV, FLU A&B, COVID)  RVPGX2
Influenza A by PCR: NEGATIVE
Influenza B by PCR: NEGATIVE
Resp Syncytial Virus by PCR: NEGATIVE
SARS Coronavirus 2 by RT PCR: NEGATIVE

## 2024-01-26 LAB — GROUP A STREP BY PCR: Group A Strep by PCR: DETECTED — AB

## 2024-01-26 MED ORDER — AMOXICILLIN 500 MG PO CAPS: 500.0000 mg | ORAL_CAPSULE | Freq: Two times a day (BID) | ORAL | 0 refills | Status: AC

## 2024-01-26 NOTE — ED Triage Notes (Signed)
 Pt states has sore throat x 2 days with congestion  Denies fever

## 2024-01-26 NOTE — ED Provider Notes (Signed)
 [place] [facility] Provider Note   CSN: [id number] Arrival date & time: 01/26/24  9087     Patient presents with: Sore Throat   Heather Sharp is a 33 y.o. female presents with a 2-day history of sore throat with nasal congestion.  Has been using over-the-counter decongestants as well as Tylenol  with no relief of her pain.  Denies any difficulty swallowing however does state that it is painful to swallow.    Sore Throat       Prior to Admission medications   Medication Sig Start Date End Date Taking? Authorizing Provider  amoxicillin  (AMOXIL ) 500 MG capsule Take 1 capsule (500 mg total) by mouth 2 (two) times daily for 10 days. 01/26/24 02/05/24 Yes Myriam Dorn BROCKS, PA  benzonatate  (TESSALON ) 100 MG capsule Take 1 capsule (100 mg total) by mouth every 8 (eight) hours. 06/12/23   Desiderio Chew, PA-C  dexamethasone  (DECADRON ) 0.1 % ophthalmic solution Place 2 drops in left ear every 12 hours for 7 days 11/25/23   Silver Wonda LABOR, PA  diclofenac  (VOLTAREN ) 75 MG EC tablet Take 1 tablet (75 mg total) by mouth 2 (two) times daily. 06/05/22   Sofia, Leslie K, PA-C    Allergies: Patient has no known allergies.    Review of Systems  HENT:  Positive for sore throat.   All other systems reviewed and are negative.   Updated Vital Signs BP 118/83   Pulse 85   Temp 98.3 F (36.8 C)   Resp 18   Ht 5' 5 (1.651 m)   Wt 68 kg   SpO2 100%   BMI 24.95 kg/m   Physical Exam Vitals and nursing note reviewed.  Constitutional:      General: She is not in acute distress.    Appearance: She is well-developed.  HENT:     Head: Normocephalic and atraumatic.     Right Ear: Tympanic membrane and ear canal normal.     Left Ear: Tympanic membrane and ear canal normal.     Nose: Congestion present.     Mouth/Throat:     Mouth: Mucous membranes are moist.     Pharynx: Oropharynx is clear. Uvula midline. Posterior oropharyngeal erythema present.      Tonsils: No tonsillar exudate or tonsillar abscesses.  Eyes:     Conjunctiva/sclera: Conjunctivae normal.  Cardiovascular:     Rate and Rhythm: Normal rate and regular rhythm.     Heart sounds: No murmur heard. Pulmonary:     Effort: Pulmonary effort is normal. No respiratory distress.     Breath sounds: Normal breath sounds.  Abdominal:     Palpations: Abdomen is soft.     Tenderness: There is no abdominal tenderness.  Musculoskeletal:        General: No swelling.     Cervical back: Neck supple.  Skin:    General: Skin is warm and dry.     Capillary Refill: Capillary refill takes less than 2 seconds.  Neurological:     Mental Status: She is alert.  Psychiatric:        Mood and Affect: Mood normal.     (all labs ordered are listed, but only abnormal results are displayed) Labs Reviewed  GROUP A STREP BY PCR - Abnormal; Notable for the following components:      Result Value   Group A Strep by PCR DETECTED (*)    All other components within normal limits  RESP PANEL BY RT-PCR (RSV, FLU  A&B, COVID)  RVPGX2    EKG: None  Radiology: No results found.   Procedures   Medications Ordered in the ED - No data to display                                  Medical Decision Making Risk Prescription drug management.   Given her symptoms, strep swab was obtained which does show positive for group A strep.  As a result, we will manage this with outpatient course of amoxicillin  for 10 days, and follow-up to be made with primary care as needed over the next 2 weeks.  She understands and agrees, has no further concerns at this time.     Final diagnoses:  Strep pharyngitis    ED Discharge Orders          Ordered    amoxicillin  (AMOXIL ) 500 MG capsule  2 times daily        01/26/24 1104               Myriam Dorn BROCKS, [place] 01/26/24 1107    Tegeler, Lonni PARAS, MD 01/26/24 [phone]

## 2024-05-15 ENCOUNTER — Encounter (HOSPITAL_BASED_OUTPATIENT_CLINIC_OR_DEPARTMENT_OTHER): Payer: Self-pay | Admitting: Emergency Medicine

## 2024-05-15 ENCOUNTER — Emergency Department (HOSPITAL_BASED_OUTPATIENT_CLINIC_OR_DEPARTMENT_OTHER)
Admission: EM | Admit: 2024-05-15 | Discharge: 2024-05-15 | Disposition: A | Discharge status: Home / Self Care | Attending: Emergency Medicine | Admitting: Emergency Medicine

## 2024-05-15 ENCOUNTER — Other Ambulatory Visit: Payer: Self-pay

## 2024-05-15 DIAGNOSIS — H6691 Otitis media, unspecified, right ear: Secondary | ICD-10-CM | POA: Diagnosis not present

## 2024-05-15 DIAGNOSIS — B338 Other specified viral diseases: Secondary | ICD-10-CM

## 2024-05-15 DIAGNOSIS — B974 Respiratory syncytial virus as the cause of diseases classified elsewhere: Secondary | ICD-10-CM | POA: Insufficient documentation

## 2024-05-15 DIAGNOSIS — R0981 Nasal congestion: Secondary | ICD-10-CM | POA: Diagnosis present

## 2024-05-15 DIAGNOSIS — J069 Acute upper respiratory infection, unspecified: Secondary | ICD-10-CM | POA: Diagnosis not present

## 2024-05-15 LAB — RESP PANEL BY RT-PCR (RSV, FLU A&B, COVID)  RVPGX2
Influenza A by PCR: NEGATIVE
Influenza B by PCR: NEGATIVE
Resp Syncytial Virus by PCR: POSITIVE — AB
SARS Coronavirus 2 by RT PCR: NEGATIVE

## 2024-05-15 MED ORDER — AMOXICILLIN-POT CLAVULANATE 875-125 MG PO TABS: 1.0000 | ORAL_TABLET | Freq: Two times a day (BID) | ORAL | 0 refills | Status: AC

## 2024-05-15 MED ORDER — BENZONATATE 100 MG PO CAPS: 100.0000 mg | ORAL_CAPSULE | Freq: Three times a day (TID) | ORAL | 0 refills | Status: AC

## 2024-05-15 MED ORDER — BENZONATATE 100 MG PO CAPS
100.0000 mg | ORAL_CAPSULE | Freq: Once | ORAL | Status: AC
  Administered 2024-05-15: 100 mg via ORAL
  Filled 2024-05-15: qty 1

## 2024-05-15 MED ORDER — AMOXICILLIN-POT CLAVULANATE 875-125 MG PO TABS
1.0000 | ORAL_TABLET | Freq: Once | ORAL | Status: AC
  Administered 2024-05-15: 1 via ORAL
  Filled 2024-05-15: qty 1

## 2024-05-15 MED ORDER — IBUPROFEN 800 MG PO TABS
800.0000 mg | ORAL_TABLET | Freq: Once | ORAL | Status: AC
  Administered 2024-05-15: 800 mg via ORAL
  Filled 2024-05-15: qty 1

## 2024-05-15 NOTE — Discharge Instructions (Addendum)
 It was a pleasure taking care of you today.  Based on your history and physical exam I feel you are safe for discharge.  RSV is a virus and typically lasts 7 to 14 days.  Please continue over-the-counter Tylenol  and ibuprofen  to help with your symptoms.  You may take up to 1000 mg of Tylenol  in a single dose, please do not take more than 4000 mg in a single day.  You may take up to 6 to 800 mg of ibuprofen  in a single dose. Also recommend Flonase for nasal congestion.  I have sent in an antibiotic called Augmentin  to the pharmacy for your right ear infection.  Please pick it up and take as prescribed and complete the entire course.  Please continue to monitor your symptoms.  If you develop chest pain, shortness of breath, trouble breathing, worsening right ear pain, refractory fever, or other concerning symptom please return to the emergency department or seek further medical care.  I have also sent in a prescription for Tessalon  Perles which is a cough medication, please take as prescribed.  If symptoms persist recommend follow-up with your primary care provider in 1 week, sooner if symptoms warrant.  If symptoms worsen recommend follow-up within 48 hours.

## 2024-05-15 NOTE — ED Triage Notes (Signed)
 Pt c/o sinus pain, nasal congestion, right ear pain/muffling, dry cough since yesterday. Pt works in a daycare.

## 2024-05-15 NOTE — ED Provider Notes (Signed)
 " [facility] Provider Note   CSN: [id number] Arrival date & time: 05/15/24  1302     Patient presents with: Ear Pain   Heather Sharp is a 33 y.o. female who presents to the emergency department with a chief complaint of sinus pain, nasal congestion, right ear pain and muffled hearing.  Patient states that onset of symptoms was yesterday.  Denies known fever or chills.  Denies chest pain or shortness of breath.  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  Denies significant past medical history and takes no prescription medications at home.  Patient denies pregnancy risk.   HPI     Prior to Admission medications  Medication Sig Start Date End Date Taking? Authorizing Provider  amoxicillin -clavulanate (AUGMENTIN ) 875-125 MG tablet Take 1 tablet by mouth every 12 (twelve) hours for 7 days. 05/15/24 05/22/24 Yes Sayuri Rhames F, PA-C  benzonatate  (TESSALON ) 100 MG capsule Take 1 capsule (100 mg total) by mouth every 8 (eight) hours. 05/15/24  Yes Manny Vitolo F, PA-C  dexamethasone  (DECADRON ) 0.1 % ophthalmic solution Place 2 drops in left ear every 12 hours for 7 days 11/25/23   Silver Fell A, PA  diclofenac  (VOLTAREN ) 75 MG EC tablet Take 1 tablet (75 mg total) by mouth 2 (two) times daily. 06/05/22   Sofia, Leslie K, PA-C    Allergies: Patient has no known allergies.    Review of Systems  HENT:  Positive for ear pain.     Updated Vital Signs BP 118/88 (BP Location: Right Arm)   Pulse 84   Temp 98.4 F (36.9 C) (Oral)   Resp 16   Ht 5' 5 (1.651 m)   Wt 64.9 kg   LMP  (Approximate)   SpO2 100%   BMI 23.80 kg/m   Physical Exam Vitals and nursing note reviewed.  Constitutional:      General: She is awake. She is not in acute distress.    Appearance: Normal appearance. She is not ill-appearing, toxic-appearing or diaphoretic.  HENT:     Head: Normocephalic and  atraumatic.     Right Ear: Tympanic membrane is injected and erythematous.     Left Ear: Tympanic membrane, ear canal and external ear normal. There is no impacted cerumen. Tympanic membrane is not injected or erythematous.     Nose: Congestion present.     Mouth/Throat:     Mouth: Mucous membranes are moist.     Pharynx: No oropharyngeal exudate or posterior oropharyngeal erythema.     Comments: Uvula midline, patient talking in full sentences on room air Eyes:     General: No scleral icterus.       Right eye: No discharge.        Left eye: No discharge.  Neck:     Comments: No stiff neck, patient able to touch chin to chest Cardiovascular:     Rate and Rhythm: Normal rate and regular rhythm.  Pulmonary:     Effort: Pulmonary effort is normal. No respiratory distress.     Breath sounds: No stridor. No wheezing, rhonchi or rales.  Musculoskeletal:        General: Normal range of motion.     Cervical back: Normal range of motion. No rigidity or tenderness.     Right lower leg: No edema.     Left lower leg: No edema.     Comments: Grossly normal range of motion  of all 4 extremities, patient ambulatory without assistance  Lymphadenopathy:     Cervical: No cervical adenopathy.  Skin:    General: Skin is warm.     Capillary Refill: Capillary refill takes less than 2 seconds.     Comments: No obvious rashes or lesions  Neurological:     General: No focal deficit present.     Mental Status: She is alert and oriented to person, place, and time.  Psychiatric:        Mood and Affect: Mood normal.        Behavior: Behavior normal. Behavior is cooperative.     (all labs ordered are listed, but only abnormal results are displayed) Labs Reviewed  RESP PANEL BY RT-PCR (RSV, FLU A&B, COVID)  RVPGX2 - Abnormal; Notable for the following components:      Result Value   Resp Syncytial Virus by PCR POSITIVE (*)    All other components within normal limits    EKG: None  Radiology: No  results found.   Procedures   Medications Ordered in the ED  amoxicillin -clavulanate (AUGMENTIN ) 875-125 MG per tablet 1 tablet (1 tablet Oral Given 05/15/24 1559)  ibuprofen  (ADVIL ) tablet 800 mg (800 mg Oral Given 05/15/24 1559)  benzonatate  (TESSALON ) capsule 100 mg (100 mg Oral Given 05/15/24 1559)                                    Medical Decision Making Risk Prescription drug management.   Patient presents to the ED for concern of URI-like symptoms, R ear pain, this involves an extensive number of treatment options, and is a complaint that carries with it a high risk of complications and morbidity.  The differential diagnosis includes otitis media, otitis externa, sinusitis, Covid, Flu, RSV, eustachian tube dysfunction, etc.   Co morbidities that complicate the patient evaluation  none   Lab Tests:  I Ordered, and personally interpreted labs.  The pertinent results include: Respiratory panel positive for RSV   Medicines ordered and prescription drug management:  I ordered medication including Tessalon  Perles, ibuprofen  for cough and pain, Augmentin  for right otitis media Reevaluation of the patient after these medicines showed that the patient improved I have reviewed the patients home medicines and have made adjustments as needed   Test Considered:  Chest x-ray: Deferred at this time as patient has normal vital signs and is 100% on room air, no abnormal lung sounds with auscultation, no shortness of breath, no chest pain   Critical Interventions:  None   Problem List / ED Course:  33 year old female, presents to the emergency department with a chief complaint of URI-like symptoms as well as right ear pain, 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, no significant past medical history and takes no prescription medications at home On physical exam right ear does appear infected with erythematous and injected TM, no wheezing or abnormal respiratory sounds  with auscultation, patient otherwise well-appearing Will treat symptomatically for RSV and give antibiotics to cover right ear infection Patient instructed on supportive care outpatient Return precautions given Patient discharged Most likely diagnosis at this time is right otitis media in the setting of RSV infection, vitals stable   Reevaluation:  After the interventions noted above, I reevaluated the patient and found that they have :improved   Social Determinants of Health:  none   Dispostion:  After consideration of the diagnostic results and the  patients response to treatment, I feel that the patient would benefit from discharge and outpatient therapy as described.     Final diagnoses:  Infection due to respiratory syncytial virus (RSV), unspecified infection type  Right otitis media, unspecified otitis media type    ED Discharge Orders          Ordered    amoxicillin -clavulanate (AUGMENTIN ) 875-125 MG tablet  Every 12 hours        05/15/24 1611    benzonatate  (TESSALON ) 100 MG capsule  Every 8 hours        05/15/24 1611               Ariana Juul F, PA-C 05/15/24 2302  "
